# Patient Record
Sex: Female | Born: 1950 | Race: White | Hispanic: No | Smoking: Current every day smoker
Health system: Southern US, Community
[De-identification: ages and names within clinical notes are randomized; demographics above are authoritative.]

## PROBLEM LIST (undated history)

## (undated) DIAGNOSIS — E785 Hyperlipidemia, unspecified: Secondary | ICD-10-CM

## (undated) DIAGNOSIS — F32A Depression, unspecified: Secondary | ICD-10-CM

## (undated) DIAGNOSIS — Z9049 Acquired absence of other specified parts of digestive tract: Secondary | ICD-10-CM

## (undated) DIAGNOSIS — Z90710 Acquired absence of both cervix and uterus: Secondary | ICD-10-CM

## (undated) DIAGNOSIS — F419 Anxiety disorder, unspecified: Secondary | ICD-10-CM

## (undated) DIAGNOSIS — M1990 Unspecified osteoarthritis, unspecified site: Secondary | ICD-10-CM

## (undated) DIAGNOSIS — M797 Fibromyalgia: Secondary | ICD-10-CM

## (undated) HISTORY — PX: CHOLECYSTECTOMY: SHX55

## (undated) HISTORY — PX: APPENDECTOMY: SHX54

---

## 1998-04-12 ENCOUNTER — Inpatient Hospital Stay (HOSPITAL_COMMUNITY): Admission: RE | Admit: 1998-04-12 | Discharge: 1998-04-15 | Payer: Self-pay | Admitting: Obstetrics and Gynecology

## 1999-07-01 ENCOUNTER — Other Ambulatory Visit: Admission: RE | Admit: 1999-07-01 | Discharge: 1999-07-01 | Payer: Self-pay | Admitting: Obstetrics and Gynecology

## 2002-02-05 ENCOUNTER — Emergency Department (HOSPITAL_COMMUNITY): Admission: EM | Admit: 2002-02-05 | Discharge: 2002-02-05 | Payer: Self-pay | Admitting: Emergency Medicine

## 2003-09-02 ENCOUNTER — Ambulatory Visit (HOSPITAL_COMMUNITY): Admission: RE | Admit: 2003-09-02 | Discharge: 2003-09-02 | Payer: Self-pay | Admitting: Family Medicine

## 2004-09-07 ENCOUNTER — Ambulatory Visit (HOSPITAL_COMMUNITY): Admission: RE | Admit: 2004-09-07 | Discharge: 2004-09-07 | Payer: Self-pay | Admitting: Family Medicine

## 2005-09-11 ENCOUNTER — Ambulatory Visit (HOSPITAL_COMMUNITY): Admission: RE | Admit: 2005-09-11 | Discharge: 2005-09-11 | Payer: Self-pay | Admitting: Family Medicine

## 2005-10-11 ENCOUNTER — Ambulatory Visit (HOSPITAL_COMMUNITY): Admission: RE | Admit: 2005-10-11 | Discharge: 2005-10-11 | Payer: Self-pay | Admitting: Family Medicine

## 2006-04-18 ENCOUNTER — Ambulatory Visit (HOSPITAL_COMMUNITY): Admission: RE | Admit: 2006-04-18 | Discharge: 2006-04-18 | Payer: Self-pay | Admitting: Family Medicine

## 2006-09-26 ENCOUNTER — Ambulatory Visit (HOSPITAL_COMMUNITY): Admission: RE | Admit: 2006-09-26 | Discharge: 2006-09-26 | Payer: Self-pay | Admitting: Family Medicine

## 2007-10-02 ENCOUNTER — Ambulatory Visit (HOSPITAL_COMMUNITY): Admission: RE | Admit: 2007-10-02 | Discharge: 2007-10-02 | Payer: Self-pay | Admitting: Family Medicine

## 2007-10-05 ENCOUNTER — Emergency Department (HOSPITAL_COMMUNITY): Admission: EM | Admit: 2007-10-05 | Discharge: 2007-10-05 | Payer: Self-pay | Admitting: Emergency Medicine

## 2008-10-21 ENCOUNTER — Ambulatory Visit (HOSPITAL_COMMUNITY): Admission: RE | Admit: 2008-10-21 | Discharge: 2008-10-21 | Payer: Self-pay | Admitting: Family Medicine

## 2009-10-25 ENCOUNTER — Ambulatory Visit (HOSPITAL_COMMUNITY): Admission: RE | Admit: 2009-10-25 | Discharge: 2009-10-25 | Payer: Self-pay | Admitting: Family Medicine

## 2010-10-28 ENCOUNTER — Other Ambulatory Visit (HOSPITAL_COMMUNITY): Payer: Self-pay | Admitting: Family Medicine

## 2010-10-28 DIAGNOSIS — Z139 Encounter for screening, unspecified: Secondary | ICD-10-CM

## 2010-11-07 ENCOUNTER — Ambulatory Visit (HOSPITAL_COMMUNITY)
Admission: RE | Admit: 2010-11-07 | Discharge: 2010-11-07 | Disposition: A | Discharge status: Home / Self Care | Payer: 59 | Source: Ambulatory Visit | Attending: Family Medicine | Admitting: Family Medicine

## 2010-11-07 DIAGNOSIS — Z1231 Encounter for screening mammogram for malignant neoplasm of breast: Secondary | ICD-10-CM | POA: Insufficient documentation

## 2010-11-07 DIAGNOSIS — Z139 Encounter for screening, unspecified: Secondary | ICD-10-CM

## 2011-10-10 ENCOUNTER — Other Ambulatory Visit (HOSPITAL_COMMUNITY): Payer: Self-pay | Admitting: Family Medicine

## 2011-10-10 DIAGNOSIS — Z139 Encounter for screening, unspecified: Secondary | ICD-10-CM

## 2011-11-13 ENCOUNTER — Ambulatory Visit (HOSPITAL_COMMUNITY)
Admission: RE | Admit: 2011-11-13 | Discharge: 2011-11-13 | Disposition: A | Discharge status: Home / Self Care | Payer: 59 | Source: Ambulatory Visit | Attending: Family Medicine | Admitting: Family Medicine

## 2011-11-13 DIAGNOSIS — Z139 Encounter for screening, unspecified: Secondary | ICD-10-CM

## 2011-11-13 DIAGNOSIS — Z1231 Encounter for screening mammogram for malignant neoplasm of breast: Secondary | ICD-10-CM | POA: Insufficient documentation

## 2012-12-18 ENCOUNTER — Encounter (HOSPITAL_COMMUNITY): Payer: Self-pay

## 2012-12-18 ENCOUNTER — Emergency Department (HOSPITAL_COMMUNITY): Payer: Self-pay

## 2012-12-18 ENCOUNTER — Emergency Department (HOSPITAL_COMMUNITY)
Admission: EM | Admit: 2012-12-18 | Discharge: 2012-12-18 | Disposition: A | Discharge status: Home / Self Care | Payer: Self-pay | Attending: Emergency Medicine | Admitting: Emergency Medicine

## 2012-12-18 DIAGNOSIS — Z862 Personal history of diseases of the blood and blood-forming organs and certain disorders involving the immune mechanism: Secondary | ICD-10-CM | POA: Insufficient documentation

## 2012-12-18 DIAGNOSIS — M7650 Patellar tendinitis, unspecified knee: Secondary | ICD-10-CM | POA: Insufficient documentation

## 2012-12-18 DIAGNOSIS — Z79899 Other long term (current) drug therapy: Secondary | ICD-10-CM | POA: Insufficient documentation

## 2012-12-18 DIAGNOSIS — Z8739 Personal history of other diseases of the musculoskeletal system and connective tissue: Secondary | ICD-10-CM | POA: Insufficient documentation

## 2012-12-18 DIAGNOSIS — M76892 Other specified enthesopathies of left lower limb, excluding foot: Secondary | ICD-10-CM

## 2012-12-18 DIAGNOSIS — Z8639 Personal history of other endocrine, nutritional and metabolic disease: Secondary | ICD-10-CM | POA: Insufficient documentation

## 2012-12-18 DIAGNOSIS — M25469 Effusion, unspecified knee: Secondary | ICD-10-CM | POA: Insufficient documentation

## 2012-12-18 HISTORY — DX: Hyperlipidemia, unspecified: E78.5

## 2012-12-18 HISTORY — DX: Fibromyalgia: M79.7

## 2012-12-18 MED ORDER — IBUPROFEN 800 MG PO TABS: 800.0000 mg | ORAL_TABLET | Freq: Three times a day (TID) | ORAL | Status: DC

## 2012-12-18 NOTE — ED Notes (Signed)
Pt c/o pain and swelling to left knee for several days.  Pt states she is on her feet a lot at work but denies new injury.

## 2012-12-18 NOTE — ED Provider Notes (Signed)
  CSN: [id number]     Arrival date & time 12/18/12  0630 History     First MD Initiated Contact with Patient 12/18/12 [phone]     Chief Complaint  Patient presents with  . Knee Pain   (Consider location/radiation/quality/duration/timing/severity/associated sxs/prior Treatment) HPI Comments: Patient presents with left knee pain and swelling. This reports that the pain began several days ago. She reports that it hurts more when she stands for long periods of time. She reports that she worked yesterday, was on her feet for a period of time and the pain is now worse. She denies any injury to the area.  Patient is a 62 y.o. female presenting with knee pain.  Knee Pain Associated symptoms: no fever     Past Medical History  Diagnosis Date  . Hyperlipidemia   . Fibromyalgia    No past surgical history on file. No family history on file. History  Substance Use Topics  . Smoking status: Not on file  . Smokeless tobacco: Not on file  . Alcohol Use: Not on file   OB History   Grav Para Term Preterm Abortions TAB SAB Ect Mult Living                 Review of Systems  Constitutional: Negative for fever.  Musculoskeletal: Positive for joint swelling and arthralgias.    Allergies  Review of patient's allergies indicates no known allergies.  Home Medications   Current Outpatient Rx  Name  Route  Sig  Dispense  Refill  . FLUoxetine (PROZAC) 20 MG capsule   Oral   Take 20 mg by mouth daily.         Marland Kitchen HYDROcodone-acetaminophen (NORCO) 10-325 MG per tablet   Oral   Take 1 tablet by mouth every 6 (six) hours as needed for pain.          BP 143/92  Pulse 94  Temp(Src) 97.4 F (36.3 C) (Oral)  Resp 20  Ht 5' 2.5" (1.588 m)  Wt 138 lb (62.596 kg)  BMI 24.82 kg/m2  SpO2 96% Physical Exam  Constitutional: She appears well-developed.  HENT:  Head: Normocephalic.  Musculoskeletal: Normal range of motion.       Legs: Tenderness is infero-medial to joint    ED Course    Procedures (including critical care time)  Labs Reviewed - No data to display No results found.  Diagnosis: Tendinitis  MDM  Condition presents to ER with complaints nontraumatic left knee pain. Pain is distal to the joint space, medial aspect of the knee. There is no joint effusion, redness, warmth or swelling. Symptoms are consistent with a tendinitis. She already takes hydrocodone for her fibromyalgia. Will add anti-inflammatory, rest.  Gilda Crease, MD 12/18/12 [phone]

## 2014-07-22 ENCOUNTER — Other Ambulatory Visit (HOSPITAL_COMMUNITY): Payer: Self-pay | Admitting: Family Medicine

## 2014-07-22 DIAGNOSIS — Z1231 Encounter for screening mammogram for malignant neoplasm of breast: Secondary | ICD-10-CM

## 2014-07-24 ENCOUNTER — Encounter (INDEPENDENT_AMBULATORY_CARE_PROVIDER_SITE_OTHER): Payer: Self-pay | Admitting: *Deleted

## 2014-07-29 ENCOUNTER — Ambulatory Visit (HOSPITAL_COMMUNITY): Payer: Self-pay

## 2014-07-30 ENCOUNTER — Encounter (INDEPENDENT_AMBULATORY_CARE_PROVIDER_SITE_OTHER): Payer: Self-pay | Admitting: *Deleted

## 2014-07-30 ENCOUNTER — Other Ambulatory Visit (INDEPENDENT_AMBULATORY_CARE_PROVIDER_SITE_OTHER): Payer: Self-pay | Admitting: *Deleted

## 2014-07-30 DIAGNOSIS — Z1211 Encounter for screening for malignant neoplasm of colon: Secondary | ICD-10-CM

## 2014-08-19 ENCOUNTER — Telehealth (INDEPENDENT_AMBULATORY_CARE_PROVIDER_SITE_OTHER): Payer: Self-pay | Admitting: *Deleted

## 2014-08-19 DIAGNOSIS — Z1211 Encounter for screening for malignant neoplasm of colon: Secondary | ICD-10-CM

## 2014-08-19 NOTE — Telephone Encounter (Signed)
Patent needs trilyte 

## 2014-08-20 MED ORDER — PEG 3350-KCL-NA BICARB-NACL 420 G PO SOLR: 4000.0000 mL | Freq: Once | ORAL | Status: DC

## 2014-09-08 ENCOUNTER — Telehealth (INDEPENDENT_AMBULATORY_CARE_PROVIDER_SITE_OTHER): Payer: Self-pay | Admitting: *Deleted

## 2014-09-08 NOTE — Telephone Encounter (Signed)
agree

## 2014-09-08 NOTE — Telephone Encounter (Signed)
Referring MD/PCP: knowlton   Procedure: tcs  Reason/Indication:  screening  Has patient had this procedure before?  no  If so, when, by whom and where?    Is there a family history of colon cancer?  no  Who?  What age when diagnosed?    Is patient diabetic?   no      Does patient have prosthetic heart valve?  no  Do you have a pacemaker?  no  Has patient ever had endocarditis? no  Has patient had joint replacement within last 12 months?  no  Does patient tend to be constipated or take laxatives? no  Is patient on Coumadin, Plavix and/or Aspirin? yes  Medications: asa 81 mg daily  Allergies: nkda  Medication Adjustment: asa 2 days  Procedure date & time: 10/07/14 at 930

## 2014-10-05 ENCOUNTER — Ambulatory Visit (HOSPITAL_COMMUNITY)
Admission: RE | Admit: 2014-10-05 | Discharge: 2014-10-05 | Disposition: A | Discharge status: Home / Self Care | Payer: 59 | Source: Ambulatory Visit | Attending: Family Medicine | Admitting: Family Medicine

## 2014-10-05 DIAGNOSIS — Z1231 Encounter for screening mammogram for malignant neoplasm of breast: Secondary | ICD-10-CM | POA: Insufficient documentation

## 2014-10-07 ENCOUNTER — Ambulatory Visit (HOSPITAL_COMMUNITY)
Admission: RE | Admit: 2014-10-07 | Discharge: 2014-10-07 | Disposition: A | Discharge status: Home / Self Care | Payer: 59 | Source: Ambulatory Visit | Attending: Internal Medicine | Admitting: Internal Medicine

## 2014-10-07 ENCOUNTER — Encounter (HOSPITAL_COMMUNITY)
Admission: RE | Disposition: A | Discharge status: Home / Self Care | Payer: Self-pay | Source: Ambulatory Visit | Attending: Internal Medicine

## 2014-10-07 ENCOUNTER — Encounter (HOSPITAL_COMMUNITY): Payer: Self-pay | Admitting: *Deleted

## 2014-10-07 DIAGNOSIS — Z1211 Encounter for screening for malignant neoplasm of colon: Secondary | ICD-10-CM | POA: Diagnosis not present

## 2014-10-07 DIAGNOSIS — Z79899 Other long term (current) drug therapy: Secondary | ICD-10-CM | POA: Diagnosis not present

## 2014-10-07 DIAGNOSIS — Z9049 Acquired absence of other specified parts of digestive tract: Secondary | ICD-10-CM | POA: Insufficient documentation

## 2014-10-07 DIAGNOSIS — D125 Benign neoplasm of sigmoid colon: Secondary | ICD-10-CM | POA: Diagnosis not present

## 2014-10-07 DIAGNOSIS — Z7982 Long term (current) use of aspirin: Secondary | ICD-10-CM | POA: Diagnosis not present

## 2014-10-07 DIAGNOSIS — F17210 Nicotine dependence, cigarettes, uncomplicated: Secondary | ICD-10-CM | POA: Insufficient documentation

## 2014-10-07 DIAGNOSIS — E785 Hyperlipidemia, unspecified: Secondary | ICD-10-CM | POA: Insufficient documentation

## 2014-10-07 DIAGNOSIS — D124 Benign neoplasm of descending colon: Secondary | ICD-10-CM | POA: Diagnosis not present

## 2014-10-07 DIAGNOSIS — M797 Fibromyalgia: Secondary | ICD-10-CM | POA: Insufficient documentation

## 2014-10-07 HISTORY — DX: Acquired absence of both cervix and uterus: Z90.710

## 2014-10-07 HISTORY — DX: Acquired absence of other specified parts of digestive tract: Z90.49

## 2014-10-07 HISTORY — PX: COLONOSCOPY: SHX5424

## 2014-10-07 SURGERY — COLONOSCOPY
Anesthesia: Moderate Sedation

## 2014-10-07 MED ORDER — STERILE WATER FOR IRRIGATION IR SOLN
Status: DC | PRN
  Administered 2014-10-07: 09:00:00

## 2014-10-07 MED ORDER — MEPERIDINE HCL 50 MG/ML IJ SOLN
INTRAMUSCULAR | Status: DC | PRN
  Administered 2014-10-07 (×2): 25 mg via INTRAVENOUS

## 2014-10-07 MED ORDER — MIDAZOLAM HCL 5 MG/5ML IJ SOLN
INTRAMUSCULAR | Status: AC
  Filled 2014-10-07: qty 10

## 2014-10-07 MED ORDER — SODIUM CHLORIDE 0.9 % IV SOLN
INTRAVENOUS | Status: DC
  Administered 2014-10-07: 1000 mL via INTRAVENOUS

## 2014-10-07 MED ORDER — MIDAZOLAM HCL 5 MG/5ML IJ SOLN
INTRAMUSCULAR | Status: DC | PRN
  Administered 2014-10-07: 1 mg via INTRAVENOUS
  Administered 2014-10-07: 2 mg via INTRAVENOUS
  Administered 2014-10-07: 1 mg via INTRAVENOUS
  Administered 2014-10-07 (×2): 2 mg via INTRAVENOUS

## 2014-10-07 MED ORDER — MEPERIDINE HCL 50 MG/ML IJ SOLN
INTRAMUSCULAR | Status: AC
  Filled 2014-10-07: qty 1

## 2014-10-07 NOTE — Discharge Instructions (Signed)
No aspirin or NSAIDs for 1 week. Resume other medications and diet as before. No driving for 24 hours. Physician will call with biopsy results.  Colonoscopy, Care After Refer to this sheet in the next few weeks. These instructions provide you with information on caring for yourself after your procedure. Your health care provider may also give you more specific instructions. Your treatment has been planned according to current medical practices, but problems sometimes occur. Call your health care provider if you have any problems or questions after your procedure. WHAT TO EXPECT AFTER THE PROCEDURE  After your procedure, it is typical to have the following:  A small amount of blood in your stool.  Moderate amounts of gas and mild abdominal cramping or bloating. HOME CARE INSTRUCTIONS  Do not drive, operate machinery, or sign important documents for 24 hours.  You may shower and resume your regular physical activities, but move at a slower pace for the first 24 hours.  Take frequent rest periods for the first 24 hours.  Walk around or put a warm pack on your abdomen to help reduce abdominal cramping and bloating.  Drink enough fluids to keep your urine clear or pale yellow.  You may resume your normal diet as instructed by your health care provider. Avoid heavy or fried foods that are hard to digest.  Avoid drinking alcohol for 24 hours or as instructed by your health care provider.  Only take over-the-counter or prescription medicines as directed by your health care provider.  If a tissue sample (biopsy) was taken during your procedure:  Do not take aspirin or blood thinners for 7 days, or as instructed by your health care provider.  Do not drink alcohol for 7 days, or as instructed by your health care provider.  Eat soft foods for the first 24 hours. SEEK MEDICAL CARE IF: You have persistent spotting of blood in your stool 2-3 days after the procedure. SEEK IMMEDIATE MEDICAL  CARE IF:  You have more than a small spotting of blood in your stool.  You pass large blood clots in your stool.  Your abdomen is swollen (distended).  You have nausea or vomiting.  You have a fever.  You have increasing abdominal pain that is not relieved with medicine. Document Released: 12/14/2003 Document Revised: 02/19/2013 Document Reviewed: 01/06/2013 [facility] Patient Information 2015 [place], [place]. This information is not intended to replace advice given to you by your health care provider. Make sure you discuss any questions you have with your health care provider.

## 2014-10-07 NOTE — Op Note (Signed)
COLONOSCOPY PROCEDURE REPORT  PATIENT:  Valerie Gibbs  MR#:  [id number] Birthdate:  [birth year], 64 y.o., female Endoscopist:  Dr. Rogene Houston, MD Referred By:  Dr. Robert Bellow, MD  Procedure Date: 10/07/2014  Procedure:   Colonoscopy with snare polypectomy.  Indications:  Patient is 64 year old Caucasian female was undergoing average risk screening colonoscopy. This is patient's first exam.  Informed Consent:  The procedure and risks were reviewed with the patient and informed consent was obtained.  Medications:  Demerol 50 mg IV Versed 8 mg IV  Description of procedure:  After a digital rectal exam was performed, that colonoscope was advanced from the anus through the rectum and colon to the area of the cecum, ileocecal valve and appendiceal orifice. The cecum was deeply intubated. These structures were well-seen and photographed for the record. From the level of the cecum and ileocecal valve, the scope was slowly and cautiously withdrawn. The mucosal surfaces were carefully surveyed utilizing scope tip to flexion to facilitate fold flattening as needed. The scope was pulled down into the rectum where a thorough exam including retroflexion was performed.  Findings:   Prep excellent. 12 mm polyp on a short stalk hot snared from distal sigmoid colon. 5 mm polyp cold snared from distal sigmoid colon. Both of these polyps were submitted together. Normal rectal mucosa and anorectal junction.   Therapeutic/Diagnostic Maneuvers Performed:  See above  Complications:  None  Cecal Withdrawal Time:  13 minutes  Impression:  Examination performed to cecum. 12 mm polyp hot snared from distal sigmoid colon. 5 mm polyp cold snared from distal sigmoid colon. Both of these polyps were submitted together  Recommendations:  Standard instructions given. No aspirin or NSAIDs for 1 week. I will be contacting patient with biopsy results and further recommendations.  Bronislaw Switzer U   10/07/2014 10:02 AM  CC: Dr. Robert Bellow, MD & Dr. Rayne Du ref. provider found

## 2014-10-07 NOTE — H&P (Signed)
Valerie Gibbs is an 64 y.o. female.   Chief Complaint: Patient is here for colonoscopy. HPI: Patient is 64 year old Caucasian female who is here for screening colonoscopy. Since patient's first exam. She denies abdominal pain change in bowel habits or rectal bleeding. Family history is negative for CRC.  Past Medical History  Diagnosis Date  . Hyperlipidemia   . Fibromyalgia   . S/P hysterectomy   . S/P appendectomy   . S/P cholecystectomy     Past Surgical History  Procedure Laterality Date  . Cholecystectomy    . Appendectomy      Family History  Problem Relation Age of Onset  . Breast cancer Mother   . Cancer Mother   . Heart attack Father   . Coronary artery disease Sister   . Hyperlipidemia Sister   . Hypertension Brother   . Chronic bronchitis Brother   . Hyperlipidemia Brother    Social History:  reports that she has been smoking Cigarettes.  She has been smoking about 0.25 packs per day. She does not have any smokeless tobacco history on file. She reports that she drinks alcohol. She reports that she does not use illicit drugs.  Allergies: No Known Allergies  Medications Prior to Admission  Medication Sig Dispense Refill  . aspirin EC 81 MG tablet Take 81 mg by mouth daily.    Marland Kitchen atorvastatin (LIPITOR) 80 MG tablet Take 80 mg by mouth daily.    . nicotine (NICODERM CQ - DOSED IN MG/24 HOURS) 21 mg/24hr patch Place 21 mg onto the skin daily.    Marland Kitchen ibuprofen (ADVIL,MOTRIN) 800 MG tablet Take 1 tablet (800 mg total) by mouth 3 (three) times daily. (Patient not taking: Reported on 09/30/2014) 21 tablet 0  . polyethylene glycol-electrolytes (NULYTELY/GOLYTELY) 420 G solution Take 4,000 mLs by mouth once. (Patient not taking: Reported on 09/30/2014) 4000 mL 0    No results found for this or any previous visit (from the past 48 hour(s)). Mm Digital Screening Bilateral  10/05/2014   CLINICAL DATA:  Screening.  EXAM: DIGITAL SCREENING BILATERAL MAMMOGRAM WITH CAD  COMPARISON:   Previous exam(s).  ACR Breast Density Category b: There are scattered areas of fibroglandular density.  FINDINGS: There are no findings suspicious for malignancy. Images were processed with CAD.  IMPRESSION: No mammographic evidence of malignancy. A result letter of this screening mammogram will be mailed directly to the patient.  RECOMMENDATION: Screening mammogram in one year. (Code:SM-B-01Y)  BI-RADS CATEGORY  1: Negative.   Electronically Signed   By: Lillia Mountain M.D.   On: 10/05/2014 15:42    ROS  Blood pressure 138/84, pulse 99, temperature 97.7 F (36.5 C), temperature source Oral, resp. rate 19, height 5\' 2"  (1.575 m), weight 145 lb (65.772 kg), SpO2 96 %. Physical Exam  Constitutional: She appears well-developed and well-nourished.  HENT:  Mouth/Throat: Oropharynx is clear and moist.  Eyes: Conjunctivae are normal. No scleral icterus.  Neck: No thyromegaly present.  Cardiovascular: Normal rate and regular rhythm.   No murmur heard. Respiratory: Effort normal and breath sounds normal.  GI: Soft. She exhibits no distension and no mass. There is no tenderness.  Musculoskeletal: She exhibits no edema.  Lymphadenopathy:    She has no cervical adenopathy.  Neurological: She is alert.  Skin: Skin is warm and dry.     Assessment/Plan Average risk screening colonoscopy.  Cincere Deprey U 10/07/2014, 9:23 AM

## 2014-10-08 ENCOUNTER — Encounter (HOSPITAL_COMMUNITY): Payer: Self-pay | Admitting: Internal Medicine

## 2014-10-13 ENCOUNTER — Encounter (INDEPENDENT_AMBULATORY_CARE_PROVIDER_SITE_OTHER): Payer: Self-pay | Admitting: *Deleted

## 2015-09-14 ENCOUNTER — Other Ambulatory Visit (HOSPITAL_COMMUNITY): Payer: Self-pay | Admitting: Family Medicine

## 2015-09-14 DIAGNOSIS — Z1231 Encounter for screening mammogram for malignant neoplasm of breast: Secondary | ICD-10-CM

## 2015-10-06 ENCOUNTER — Ambulatory Visit (HOSPITAL_COMMUNITY)
Admission: RE | Admit: 2015-10-06 | Discharge: 2015-10-06 | Disposition: A | Discharge status: Home / Self Care | Payer: BLUE CROSS/BLUE SHIELD | Source: Ambulatory Visit | Attending: Family Medicine | Admitting: Family Medicine

## 2015-10-06 DIAGNOSIS — Z1231 Encounter for screening mammogram for malignant neoplasm of breast: Secondary | ICD-10-CM | POA: Diagnosis present

## 2016-06-08 ENCOUNTER — Ambulatory Visit (HOSPITAL_COMMUNITY)
Admission: RE | Admit: 2016-06-08 | Discharge: 2016-06-08 | Disposition: A | Discharge status: Home / Self Care | Payer: BLUE CROSS/BLUE SHIELD | Source: Ambulatory Visit | Attending: Family Medicine | Admitting: Family Medicine

## 2016-06-08 ENCOUNTER — Other Ambulatory Visit (HOSPITAL_COMMUNITY): Payer: Self-pay | Admitting: Family Medicine

## 2016-06-08 DIAGNOSIS — M545 Low back pain: Secondary | ICD-10-CM | POA: Diagnosis present

## 2016-06-08 DIAGNOSIS — M5387 Other specified dorsopathies, lumbosacral region: Secondary | ICD-10-CM | POA: Diagnosis not present

## 2016-06-08 DIAGNOSIS — I700 Atherosclerosis of aorta: Secondary | ICD-10-CM | POA: Insufficient documentation

## 2016-06-08 DIAGNOSIS — M5136 Other intervertebral disc degeneration, lumbar region: Secondary | ICD-10-CM | POA: Diagnosis not present

## 2016-06-28 ENCOUNTER — Other Ambulatory Visit (HOSPITAL_COMMUNITY): Payer: Self-pay | Admitting: Family Medicine

## 2016-06-28 DIAGNOSIS — G8929 Other chronic pain: Secondary | ICD-10-CM

## 2016-06-28 DIAGNOSIS — M545 Low back pain, unspecified: Secondary | ICD-10-CM

## 2016-07-04 ENCOUNTER — Ambulatory Visit (HOSPITAL_COMMUNITY)
Admission: RE | Admit: 2016-07-04 | Discharge: 2016-07-04 | Disposition: A | Discharge status: Home / Self Care | Payer: BLUE CROSS/BLUE SHIELD | Source: Ambulatory Visit | Attending: Family Medicine | Admitting: Family Medicine

## 2016-07-04 DIAGNOSIS — M545 Low back pain, unspecified: Secondary | ICD-10-CM

## 2016-07-04 DIAGNOSIS — M2578 Osteophyte, vertebrae: Secondary | ICD-10-CM | POA: Diagnosis not present

## 2016-07-04 DIAGNOSIS — M5136 Other intervertebral disc degeneration, lumbar region: Secondary | ICD-10-CM | POA: Diagnosis not present

## 2016-07-04 DIAGNOSIS — M5137 Other intervertebral disc degeneration, lumbosacral region: Secondary | ICD-10-CM | POA: Insufficient documentation

## 2016-07-04 DIAGNOSIS — G8929 Other chronic pain: Secondary | ICD-10-CM

## 2016-08-24 ENCOUNTER — Other Ambulatory Visit (HOSPITAL_COMMUNITY): Payer: Self-pay | Admitting: Family Medicine

## 2016-08-24 DIAGNOSIS — Z1231 Encounter for screening mammogram for malignant neoplasm of breast: Secondary | ICD-10-CM

## 2016-10-06 ENCOUNTER — Ambulatory Visit (HOSPITAL_COMMUNITY)
Admission: RE | Admit: 2016-10-06 | Discharge: 2016-10-06 | Disposition: A | Discharge status: Home / Self Care | Payer: Medicare Other | Source: Ambulatory Visit | Attending: Family Medicine | Admitting: Family Medicine

## 2016-10-06 DIAGNOSIS — Z1231 Encounter for screening mammogram for malignant neoplasm of breast: Secondary | ICD-10-CM | POA: Diagnosis not present

## 2017-08-29 ENCOUNTER — Other Ambulatory Visit (HOSPITAL_COMMUNITY): Payer: Self-pay | Admitting: Family Medicine

## 2017-08-29 DIAGNOSIS — Z1231 Encounter for screening mammogram for malignant neoplasm of breast: Secondary | ICD-10-CM

## 2017-10-10 ENCOUNTER — Ambulatory Visit (HOSPITAL_COMMUNITY)
Admission: RE | Admit: 2017-10-10 | Discharge: 2017-10-10 | Disposition: A | Discharge status: Home / Self Care | Payer: Medicare HMO | Source: Ambulatory Visit | Attending: Family Medicine | Admitting: Family Medicine

## 2017-10-10 DIAGNOSIS — Z1231 Encounter for screening mammogram for malignant neoplasm of breast: Secondary | ICD-10-CM | POA: Diagnosis present

## 2018-10-21 ENCOUNTER — Other Ambulatory Visit (HOSPITAL_COMMUNITY): Payer: Self-pay | Admitting: Family Medicine

## 2018-10-21 DIAGNOSIS — Z1231 Encounter for screening mammogram for malignant neoplasm of breast: Secondary | ICD-10-CM

## 2018-11-01 ENCOUNTER — Ambulatory Visit (HOSPITAL_COMMUNITY)
Admission: RE | Admit: 2018-11-01 | Discharge: 2018-11-01 | Disposition: A | Discharge status: Home / Self Care | Payer: Medicare HMO | Source: Ambulatory Visit | Attending: Family Medicine | Admitting: Family Medicine

## 2018-11-01 ENCOUNTER — Other Ambulatory Visit: Payer: Self-pay

## 2018-11-01 DIAGNOSIS — Z1231 Encounter for screening mammogram for malignant neoplasm of breast: Secondary | ICD-10-CM | POA: Insufficient documentation

## 2019-05-28 DIAGNOSIS — F419 Anxiety disorder, unspecified: Secondary | ICD-10-CM | POA: Diagnosis not present

## 2019-05-28 DIAGNOSIS — E782 Mixed hyperlipidemia: Secondary | ICD-10-CM | POA: Diagnosis not present

## 2019-05-28 DIAGNOSIS — Z130 Encounter for screening for diseases of the blood and blood-forming organs and certain disorders involving the immune mechanism: Secondary | ICD-10-CM | POA: Diagnosis not present

## 2019-05-28 DIAGNOSIS — R000 Tachycardia, unspecified: Secondary | ICD-10-CM | POA: Diagnosis not present

## 2019-05-28 DIAGNOSIS — M797 Fibromyalgia: Secondary | ICD-10-CM | POA: Diagnosis not present

## 2019-05-28 DIAGNOSIS — Z1321 Encounter for screening for nutritional disorder: Secondary | ICD-10-CM | POA: Diagnosis not present

## 2019-05-28 DIAGNOSIS — F3342 Major depressive disorder, recurrent, in full remission: Secondary | ICD-10-CM | POA: Diagnosis not present

## 2019-05-28 DIAGNOSIS — F17210 Nicotine dependence, cigarettes, uncomplicated: Secondary | ICD-10-CM | POA: Diagnosis not present

## 2019-05-28 DIAGNOSIS — E663 Overweight: Secondary | ICD-10-CM | POA: Diagnosis not present

## 2019-05-28 DIAGNOSIS — I10 Essential (primary) hypertension: Secondary | ICD-10-CM | POA: Diagnosis not present

## 2019-05-28 DIAGNOSIS — Z139 Encounter for screening, unspecified: Secondary | ICD-10-CM | POA: Diagnosis not present

## 2019-06-16 DIAGNOSIS — H52223 Regular astigmatism, bilateral: Secondary | ICD-10-CM | POA: Diagnosis not present

## 2019-06-16 DIAGNOSIS — Z961 Presence of intraocular lens: Secondary | ICD-10-CM | POA: Diagnosis not present

## 2019-06-16 DIAGNOSIS — H524 Presbyopia: Secondary | ICD-10-CM | POA: Diagnosis not present

## 2019-06-16 DIAGNOSIS — H5203 Hypermetropia, bilateral: Secondary | ICD-10-CM | POA: Diagnosis not present

## 2019-06-16 DIAGNOSIS — H2513 Age-related nuclear cataract, bilateral: Secondary | ICD-10-CM | POA: Diagnosis not present

## 2019-07-01 ENCOUNTER — Other Ambulatory Visit: Payer: Self-pay

## 2019-07-01 ENCOUNTER — Ambulatory Visit: Payer: Medicare HMO | Attending: Internal Medicine

## 2019-07-01 DIAGNOSIS — Z20822 Contact with and (suspected) exposure to covid-19: Secondary | ICD-10-CM

## 2019-07-02 LAB — NOVEL CORONAVIRUS, NAA: SARS-CoV-2, NAA: NOT DETECTED

## 2019-07-04 ENCOUNTER — Telehealth: Payer: Self-pay

## 2019-07-04 NOTE — Telephone Encounter (Signed)
Pt. Given COVID 19 results, verbalizes understanding. 

## 2019-09-10 ENCOUNTER — Encounter (INDEPENDENT_AMBULATORY_CARE_PROVIDER_SITE_OTHER): Payer: Self-pay | Admitting: *Deleted

## 2019-10-08 ENCOUNTER — Encounter (INDEPENDENT_AMBULATORY_CARE_PROVIDER_SITE_OTHER): Payer: Self-pay | Admitting: *Deleted

## 2019-10-08 ENCOUNTER — Other Ambulatory Visit (HOSPITAL_COMMUNITY): Payer: Self-pay | Admitting: Family Medicine

## 2019-10-08 ENCOUNTER — Other Ambulatory Visit (INDEPENDENT_AMBULATORY_CARE_PROVIDER_SITE_OTHER): Payer: Self-pay | Admitting: *Deleted

## 2019-10-08 ENCOUNTER — Telehealth (INDEPENDENT_AMBULATORY_CARE_PROVIDER_SITE_OTHER): Payer: Self-pay | Admitting: *Deleted

## 2019-10-08 DIAGNOSIS — Z1231 Encounter for screening mammogram for malignant neoplasm of breast: Secondary | ICD-10-CM

## 2019-10-08 NOTE — Telephone Encounter (Signed)
Referring MD/PCP: knowlton   Procedure: tcs w propofol  Reason/Indication:  Hx polyps  Has patient had this procedure before?  Yes, 2016  If so, when, by whom and where?    Is there a family history of colon cancer?  no  Who?  What age when diagnosed?    Is patient diabetic?   no      Does patient have prosthetic heart valve or mechanical valve?  no  Do you have a pacemaker/defibrillator?  no  Has patient ever had endocarditis/atrial fibrillation? no  Does patient use oxygen? no  Has patient had joint replacement within last 12 months?  no  Is patient constipated or do they take laxatives? no  Does patient have a history of alcohol/drug use?  no  Is patient on blood thinner such as Coumadin, Plavix and/or Aspirin? yes  Medications: gabapentin 300 mg 1-2 up to 3 times a day, clonazepam 1 mg 1/2 to 1 tab qid, atorvastatin 80 mg daily, duloxetine 60 mg bid, ezetimibe 10 mg daily  Allergies: nkda  Medication Adjustment per Dr Laural Golden: asa 2 days  Procedure date & time: 10/31/19

## 2019-10-08 NOTE — Telephone Encounter (Signed)
Patient needs Plenvu (copay card) ° °

## 2019-10-09 ENCOUNTER — Other Ambulatory Visit (INDEPENDENT_AMBULATORY_CARE_PROVIDER_SITE_OTHER): Payer: Self-pay | Admitting: *Deleted

## 2019-10-09 ENCOUNTER — Other Ambulatory Visit: Payer: Self-pay

## 2019-10-09 ENCOUNTER — Ambulatory Visit (INDEPENDENT_AMBULATORY_CARE_PROVIDER_SITE_OTHER): Payer: Self-pay

## 2019-10-09 DIAGNOSIS — Z86010 Personal history of colonic polyps: Secondary | ICD-10-CM

## 2019-10-09 MED ORDER — PLENVU 140 G PO SOLR: 1.0000 | Freq: Once | ORAL | 0 refills | Status: AC

## 2019-10-27 ENCOUNTER — Encounter (HOSPITAL_COMMUNITY): Payer: Self-pay

## 2019-10-29 ENCOUNTER — Other Ambulatory Visit (HOSPITAL_COMMUNITY)
Admission: RE | Admit: 2019-10-29 | Discharge: 2019-10-29 | Disposition: A | Discharge status: Home / Self Care | Payer: Medicare HMO | Source: Ambulatory Visit | Attending: Internal Medicine | Admitting: Internal Medicine

## 2019-10-29 ENCOUNTER — Other Ambulatory Visit: Payer: Self-pay

## 2019-10-29 ENCOUNTER — Encounter (HOSPITAL_COMMUNITY)
Admission: RE | Admit: 2019-10-29 | Discharge: 2019-10-29 | Disposition: A | Discharge status: Home / Self Care | Payer: Medicare HMO | Source: Ambulatory Visit | Attending: Internal Medicine | Admitting: Internal Medicine

## 2019-10-29 DIAGNOSIS — Z01812 Encounter for preprocedural laboratory examination: Secondary | ICD-10-CM | POA: Diagnosis present

## 2019-10-29 DIAGNOSIS — Z20822 Contact with and (suspected) exposure to covid-19: Secondary | ICD-10-CM | POA: Diagnosis not present

## 2019-10-30 LAB — SARS CORONAVIRUS 2 (TAT 6-24 HRS): SARS Coronavirus 2: NEGATIVE

## 2019-10-31 ENCOUNTER — Ambulatory Visit (HOSPITAL_COMMUNITY): Payer: Medicare HMO | Admitting: Anesthesiology

## 2019-10-31 ENCOUNTER — Encounter (HOSPITAL_COMMUNITY): Payer: Self-pay | Admitting: Internal Medicine

## 2019-10-31 ENCOUNTER — Ambulatory Visit (HOSPITAL_COMMUNITY)
Admission: RE | Admit: 2019-10-31 | Discharge: 2019-10-31 | Disposition: A | Discharge status: Home / Self Care | Payer: Medicare HMO | Attending: Internal Medicine | Admitting: Internal Medicine

## 2019-10-31 ENCOUNTER — Encounter (HOSPITAL_COMMUNITY)
Admission: RE | Disposition: A | Discharge status: Home / Self Care | Payer: Self-pay | Source: Home / Self Care | Attending: Internal Medicine

## 2019-10-31 ENCOUNTER — Other Ambulatory Visit: Payer: Self-pay

## 2019-10-31 DIAGNOSIS — K644 Residual hemorrhoidal skin tags: Secondary | ICD-10-CM

## 2019-10-31 DIAGNOSIS — Z7982 Long term (current) use of aspirin: Secondary | ICD-10-CM | POA: Diagnosis not present

## 2019-10-31 DIAGNOSIS — Z79899 Other long term (current) drug therapy: Secondary | ICD-10-CM | POA: Diagnosis not present

## 2019-10-31 DIAGNOSIS — F17210 Nicotine dependence, cigarettes, uncomplicated: Secondary | ICD-10-CM | POA: Diagnosis not present

## 2019-10-31 DIAGNOSIS — M797 Fibromyalgia: Secondary | ICD-10-CM | POA: Diagnosis not present

## 2019-10-31 DIAGNOSIS — Z86010 Personal history of colonic polyps: Secondary | ICD-10-CM | POA: Insufficient documentation

## 2019-10-31 DIAGNOSIS — Z09 Encounter for follow-up examination after completed treatment for conditions other than malignant neoplasm: Secondary | ICD-10-CM

## 2019-10-31 DIAGNOSIS — Z1211 Encounter for screening for malignant neoplasm of colon: Secondary | ICD-10-CM | POA: Diagnosis not present

## 2019-10-31 DIAGNOSIS — E785 Hyperlipidemia, unspecified: Secondary | ICD-10-CM | POA: Diagnosis not present

## 2019-10-31 HISTORY — PX: COLONOSCOPY WITH PROPOFOL: SHX5780

## 2019-10-31 SURGERY — COLONOSCOPY WITH PROPOFOL
Anesthesia: General

## 2019-10-31 MED ORDER — LIDOCAINE HCL (CARDIAC) PF 100 MG/5ML IV SOSY
PREFILLED_SYRINGE | INTRAVENOUS | Status: DC | PRN
Start: 2019-10-31 — End: 2019-10-31
  Administered 2019-10-31: 30 mg via INTRATRACHEAL

## 2019-10-31 MED ORDER — KETAMINE HCL 50 MG/5ML IJ SOSY
PREFILLED_SYRINGE | INTRAMUSCULAR | Status: AC
  Filled 2019-10-31: qty 5

## 2019-10-31 MED ORDER — LIDOCAINE 2% (20 MG/ML) 5 ML SYRINGE
INTRAMUSCULAR | Status: AC
  Filled 2019-10-31: qty 25

## 2019-10-31 MED ORDER — CHLORHEXIDINE GLUCONATE CLOTH 2 % EX PADS: 6.0000 | MEDICATED_PAD | Freq: Once | CUTANEOUS | Status: DC

## 2019-10-31 MED ORDER — KETAMINE HCL 10 MG/ML IJ SOLN
INTRAMUSCULAR | Status: DC | PRN
Start: 2019-10-31 — End: 2019-10-31
  Administered 2019-10-31: 20 mg via INTRAVENOUS

## 2019-10-31 MED ORDER — PROPOFOL 10 MG/ML IV BOLUS
INTRAVENOUS | Status: DC | PRN
  Administered 2019-10-31: 20 mg via INTRAVENOUS

## 2019-10-31 MED ORDER — MIDAZOLAM HCL 5 MG/5ML IJ SOLN
INTRAMUSCULAR | Status: AC
  Filled 2019-10-31: qty 10

## 2019-10-31 MED ORDER — PROPOFOL 500 MG/50ML IV EMUL
INTRAVENOUS | Status: DC | PRN
  Administered 2019-10-31: 125 ug/kg/min via INTRAVENOUS

## 2019-10-31 MED ORDER — MEPERIDINE HCL 50 MG/ML IJ SOLN
INTRAMUSCULAR | Status: AC
  Filled 2019-10-31: qty 1

## 2019-10-31 MED ORDER — ONDANSETRON HCL 4 MG/2ML IJ SOLN
INTRAMUSCULAR | Status: AC
  Filled 2019-10-31: qty 2

## 2019-10-31 MED ORDER — EPHEDRINE 5 MG/ML INJ
INTRAVENOUS | Status: AC
  Filled 2019-10-31: qty 20

## 2019-10-31 MED ORDER — PHENYLEPHRINE 40 MCG/ML (10ML) SYRINGE FOR IV PUSH (FOR BLOOD PRESSURE SUPPORT)
PREFILLED_SYRINGE | INTRAVENOUS | Status: AC
  Filled 2019-10-31: qty 30

## 2019-10-31 MED ORDER — LACTATED RINGERS IV SOLN: INTRAVENOUS | Status: DC

## 2019-10-31 MED ORDER — GLYCOPYRROLATE PF 0.2 MG/ML IJ SOSY
PREFILLED_SYRINGE | INTRAMUSCULAR | Status: AC
  Filled 2019-10-31: qty 5

## 2019-10-31 NOTE — Anesthesia Postprocedure Evaluation (Signed)
Anesthesia Post Note  Patient: Valerie Gibbs  Procedure(s) Performed: COLONOSCOPY WITH PROPOFOL (N/A )  Patient location during evaluation: PACU Anesthesia Type: General Level of consciousness: awake and alert Pain management: pain level controlled Vital Signs Assessment: post-procedure vital signs reviewed and stable Respiratory status: spontaneous breathing Cardiovascular status: stable Postop Assessment: no apparent nausea or vomiting Anesthetic complications: no   No complications documented.   Last Vitals:  Vitals:   10/31/19 0733 10/31/19 0852  BP:  (P) 105/67  Pulse:    Resp:    Temp: 36.7 C (!) (P) 36.3 C  SpO2:      Last Pain:  Vitals:   10/31/19 0852  TempSrc:   PainSc: (P) Asleep                 Kealy Lewter Hristova

## 2019-10-31 NOTE — H&P (Signed)
Valerie Gibbs is an 69 y.o. female.   Chief Complaint: Patient is here for colonoscopy. HPI: Patient is 69 year old Caucasian female who has history of colonic adenomas and is here for surveillance colonoscopy.  She denies abdominal pain change in bowel habits or rectal bleeding. Last aspirin dose was 2 days ago. Patient's last colonoscopy was in May 2016 with removal of a 12 mm tubular adenoma along with a smaller polyp. Family history is negative for CRC.  Past Medical History:  Diagnosis Date  . Fibromyalgia   . Hyperlipidemia   . S/P appendectomy   . S/P cholecystectomy   . S/P hysterectomy     Past Surgical History:  Procedure Laterality Date  . APPENDECTOMY    . CHOLECYSTECTOMY    . COLONOSCOPY N/A 10/07/2014   Procedure: COLONOSCOPY;  Surgeon: Rogene Houston, MD;  Location: [facility];  Service: Endoscopy;  Laterality: N/A;  930    Family History  Problem Relation Age of Onset  . Breast cancer Mother   . Cancer Mother   . Heart attack Father   . Coronary artery disease Sister   . Hyperlipidemia Sister   . Hypertension Brother   . Chronic bronchitis Brother   . Hyperlipidemia Brother    Social History:  reports that she has been smoking cigarettes. She has been smoking about 0.25 packs per day. She has never used smokeless tobacco. She reports current alcohol use. She reports that she does not use drugs.  Allergies: No Known Allergies  Medications Prior to Admission  Medication Sig Dispense Refill  . aspirin EC 81 MG tablet Take 81 mg by mouth daily.    Marland Kitchen atorvastatin (LIPITOR) 80 MG tablet Take 80 mg by mouth at bedtime.     . clonazePAM (KLONOPIN) 1 MG tablet Take 1 mg by mouth in the morning and at bedtime.    . DULoxetine (CYMBALTA) 60 MG capsule Take 60 mg by mouth in the morning and at bedtime.    Marland Kitchen ezetimibe (ZETIA) 10 MG tablet Take 10 mg by mouth at bedtime.    . gabapentin (NEURONTIN) 300 MG capsule Take 300 mg by mouth in the morning and at bedtime.     Vladimir Faster Glycol-Propyl Glycol (LUBRICANT EYE DROPS) 0.4-0.3 % SOLN Place 1 drop into both eyes 3 (three) times daily as needed (dry/irritated eyes.).    Marland Kitchen CHANTIX STARTING MONTH PAK 0.5 MG X 11 & 1 MG X 42 tablet Take 0.5-1 mg by mouth as directed. Days 1-3 take 0.5 mg tablet daily. Days 4-7 take 1 0.5 mg in the morning & evening. Day 8 to the end of treatment take 1 mg tablet in the morning.      Results for orders placed or performed during the hospital encounter of 10/29/19 (from the past 48 hour(s))  SARS CORONAVIRUS 2 (TAT 6-24 HRS) Nasopharyngeal Nasopharyngeal Swab     Status: None   Collection Time: 10/29/19  1:34 PM   Specimen: Nasopharyngeal Swab  Result Value Ref Range   SARS Coronavirus 2 NEGATIVE NEGATIVE    Comment: (NOTE) SARS-CoV-2 target nucleic acids are NOT DETECTED.  The SARS-CoV-2 RNA is generally detectable in upper and lower respiratory specimens during the acute phase of infection. Negative results do not preclude SARS-CoV-2 infection, do not rule out co-infections with other pathogens, and should not be used as the sole basis for treatment or other patient management decisions. Negative results must be combined with clinical observations, patient history, and epidemiological information. The expected  result is Negative.  Fact Sheet for Patients: SugarRoll.be  Fact Sheet for Healthcare Providers: https://www.woods-mathews.com/  This test is not yet approved or cleared by the [place] FDA and  has been authorized for detection and/or diagnosis of SARS-CoV-2 by FDA under an Emergency Use Authorization (EUA). This EUA will remain  in effect (meaning this test can be used) for the duration of the COVID-19 declaration under Se ction 564(b)(1) of the Act, 21 U.S.C. section 360bbb-3(b)(1), unless the authorization is terminated or revoked sooner.  Performed at [facility], [place] [address], [place],  [place] [address]    No results found.  Review of Systems  Blood pressure (!) 146/87, pulse 94, temperature 98 F (36.7 C), temperature source Oral, resp. rate 20, height 5' 1.5" (1.562 m), weight 56.7 kg, SpO2 93 %. Physical Exam  HENT:  Mouth/Throat: Mucous membranes are moist. Oropharynx is clear.  Eyes: Conjunctivae are normal. No scleral icterus.  Cardiovascular: Normal rate, regular rhythm and normal heart sounds.  No murmur heard. Respiratory: Effort normal and breath sounds normal.  GI:  Abdomen is full.  She has 4 scars.  She has right subcostal, lower midline appendectomy and other horizontal scar in right lower quadrant of her abdomen.  Abdomen is soft and nontender with organomegaly or masses.  Musculoskeletal:        General: No swelling.     Cervical back: Neck supple.  Lymphadenopathy:    She has no cervical adenopathy.  Neurological: She is alert.  Skin: Skin is warm and dry.  Psychiatric: Mood normal.     Assessment/Plan  History of colonic adenomas. Surveillance colonoscopy.  Hildred Laser, MD 10/31/2019, 8:16 AM

## 2019-10-31 NOTE — Op Note (Addendum)
[facility] Patient Name: Valerie Gibbs Procedure Date: 10/31/2019 8:50 AM MRN: [id number] Date of Birth: 1951/05/10 Attending MD: Hildred Laser , MD CSN: [id number] Age: 68 Admit Type: Outpatient Procedure:                Colonoscopy Indications:              High risk colon cancer surveillance: Personal                            history of colonic polyps Providers:                Hildred Laser, MD Referring MD:             Lemmie Evens, MD Medicines:                Propofol per Anesthesia Complications:            No immediate complications. Estimated Blood Loss:     Estimated blood loss: none. Procedure:                Pre-Anesthesia Assessment:                           - Prior to the procedure, a History and Physical                            was performed, and patient medications and                            allergies were reviewed. The patient's tolerance of                            previous anesthesia was also reviewed. The risks                            and benefits of the procedure and the sedation                            options and risks were discussed with the patient.                            All questions were answered, and informed consent                            was obtained. Prior Anticoagulants: The patient has                            taken no previous anticoagulant or antiplatelet                            agents except for aspirin. ASA Grade Assessment: II                            - A patient with mild systemic disease. After  reviewing the risks and benefits, the patient was                            deemed in satisfactory condition to undergo the                            procedure.                           After obtaining informed consent, the colonoscope                            was passed under direct vision. Throughout the                            procedure, the patient's blood pressure, pulse, and                             oxygen saturations were monitored continuously. The                            PCF-[id number] ([id number]) was introduced through the                            anus and advanced to the the cecum, identified by                            appendiceal orifice and ileocecal valve. The                            colonoscopy was performed without difficulty. The                            patient tolerated the procedure well. The quality                            of the bowel preparation was {skip}marginal. The                            ileocecal valve, appendiceal orifice, and rectum                            were photographed. Findings:      The perianal and digital rectal examinations were normal.      The colon (entire examined portion) appeared normal.      External hemorrhoids were found during retroflexion. The hemorrhoids       were small. Impression:               - The entire examined colon is normal.                           - External hemorrhoids.                           - No specimens collected. Moderate Sedation:  Per Anesthesia Care Recommendation:           - Patient has a contact number available for                            emergencies. The signs and symptoms of potential                            delayed complications were discussed with the                            patient. Return to normal activities tomorrow.                            Written discharge instructions were provided to the                            patient.                           - Resume previous diet today.                           - Continue present medications.                           - Repeat colonoscopy in 5 years for surveillance. Procedure Code(s):        --- Professional ---                           [phone], Colonoscopy, flexible; diagnostic, including                            collection of specimen(s) by brushing or washing,                             when performed (separate procedure) Diagnosis Code(s):        --- Professional ---                           Z86.010, Personal history of colonic polyps                           K64.4, Residual hemorrhoidal skin tags CPT copyright 2019 American Medical Association. All rights reserved. The codes documented in this report are preliminary and upon coder review may  be revised to meet current compliance requirements. Hildred Laser, MD Hildred Laser, MD 10/31/2019 9:00:46 AM This report has been signed electronically. Number of Addenda: 0

## 2019-10-31 NOTE — Transfer of Care (Signed)
Immediate Anesthesia Transfer of Care Note  Patient: [name]  Procedure(s) Performed: COLONOSCOPY WITH PROPOFOL (N/A )  Patient Location: PACU  Anesthesia Type:General  Level of Consciousness: awake  Airway & Oxygen Therapy: Patient Spontanous Breathing  Post-op Assessment: Report given to RN and Post -op Vital signs reviewed and stable  Post vital signs: Reviewed and stable  Last Vitals:  Vitals Value Taken Time  BP    Temp    Pulse 85 10/31/19 0852  Resp 15 10/31/19 0852  SpO2 94 % 10/31/19 0852  Vitals shown include unvalidated device data.  Last Pain:  Vitals:   10/31/19 0824  TempSrc:   PainSc: 0-No pain         Complications: No complications documented.

## 2019-10-31 NOTE — Discharge Instructions (Signed)
Resume usual medications and diet as before. No driving for 24 hours Next colonoscopy in 5 years.      Colonoscopy, Adult, Care After This sheet gives you information about how to care for yourself after your procedure. Your doctor may also give you more specific instructions. If you have problems or questions, call your doctor. What can I expect after the procedure? After the procedure, it is common to have:  A small amount of blood in your poop (stool) for 24 hours.  Some gas.  Mild cramping or bloating in your belly (abdomen). Follow these instructions at home: Eating and drinking   Drink enough fluid to keep your pee (urine) pale yellow.  Follow instructions from your doctor about what you cannot eat or drink.  Return to your normal diet as told by your doctor. Avoid heavy or fried foods that are hard to digest. Activity  Rest as told by your doctor.  Do not sit for a long time without moving. Get up to take short walks every 1-2 hours. This is important. Ask for help if you feel weak or unsteady.  Return to your normal activities as told by your doctor. Ask your doctor what activities are safe for you. To help cramping and bloating:   Try walking around.  Put heat on your belly as told by your doctor. Use the heat source that your doctor recommends, such as a moist heat pack or a heating pad. ? Put a towel between your skin and the heat source. ? Leave the heat on for 20-30 minutes. ? Remove the heat if your skin turns bright red. This is very important if you are unable to feel pain, heat, or cold. You may have a greater risk of getting burned. General instructions  For the first 24 hours after the procedure: ? Do not drive or use machinery. ? Do not sign important documents. ? Do not drink alcohol. ? Do your daily activities more slowly than normal. ? Eat foods that are soft and easy to digest.  Take over-the-counter or prescription medicines only as told by  your doctor.  Keep all follow-up visits as told by your doctor. This is important. Contact a doctor if:  You have blood in your poop 2-3 days after the procedure. Get help right away if:  You have more than a small amount of blood in your poop.  You see large clumps of tissue (blood clots) in your poop.  Your belly is swollen.  You feel like you may vomit (nauseous).  You vomit.  You have a fever.  You have belly pain that gets worse, and medicine does not help your pain. Summary  After the procedure, it is common to have a small amount of blood in your poop. You may also have mild cramping and bloating in your belly.  For the first 24 hours after the procedure, do not drive or use machinery, do not sign important documents, and do not drink alcohol.  Get help right away if you have a lot of blood in your poop, feel like you may vomit, have a fever, or have more belly pain. This information is not intended to replace advice given to you by your health care provider. Make sure you discuss any questions you have with your health care provider. Document Revised: 11/25/2018 Document Reviewed: 11/25/2018 Elsevier Patient Education  2020 [employer]     Monitored Anesthesia Care, Care After These instructions provide you with information about caring for   yourself after your procedure. Your health care provider may also give you more specific instructions. Your treatment has been planned according to current medical practices, but problems sometimes occur. Call your health care provider if you have any problems or questions after your procedure. What can I expect after the procedure? After your procedure, you may:  Feel sleepy for several hours.  Feel clumsy and have poor balance for several hours.  Feel forgetful about what happened after the procedure.  Have poor judgment for several hours.  Feel nauseous or vomit.  Have a sore throat if you had a breathing tube during  the procedure. Follow these instructions at home: For at least 24 hours after the procedure:      Have a responsible adult stay with you. It is important to have someone help care for you until you are awake and alert.  Rest as needed.  Do not: ? Participate in activities in which you could fall or become injured. ? Drive. ? Use heavy machinery. ? Drink alcohol. ? Take sleeping pills or medicines that cause drowsiness. ? Make important decisions or sign legal documents. ? Take care of children on your own. Eating and drinking  Follow the diet that is recommended by your health care provider.  If you vomit, drink water, juice, or soup when you can drink without vomiting.  Make sure you have little or no nausea before eating solid foods. General instructions  Take over-the-counter and prescription medicines only as told by your health care provider.  If you have sleep apnea, surgery and certain medicines can increase your risk for breathing problems. Follow instructions from your health care provider about wearing your sleep device: ? Anytime you are sleeping, including during daytime naps. ? While taking prescription pain medicines, sleeping medicines, or medicines that make you drowsy.  If you smoke, do not smoke without supervision.  Keep all follow-up visits as told by your health care provider. This is important. Contact a health care provider if:  You keep feeling nauseous or you keep vomiting.  You feel light-headed.  You develop a rash.  You have a fever. Get help right away if:  You have trouble breathing. Summary  For several hours after your procedure, you may feel sleepy and have poor judgment.  Have a responsible adult stay with you for at least 24 hours or until you are awake and alert. This information is not intended to replace advice given to you by your health care provider. Make sure you discuss any questions you have with your health care  provider. Document Revised: 07/30/2017 Document Reviewed: 08/22/2015 Elsevier Patient Education  2020 [employer] Inc.  

## 2019-10-31 NOTE — Anesthesia Preprocedure Evaluation (Signed)
Anesthesia Evaluation  Patient identified by MRN, date of birth, ID band Patient awake    Reviewed: Allergy & Precautions, H&P , NPO status , Patient's Chart, lab work & pertinent test results, reviewed documented beta blocker date and time   Airway Mallampati: II  TM Distance: >3 FB Neck ROM: full    Dental no notable dental hx.    Pulmonary neg pulmonary ROS, Current Smoker and Patient abstained from smoking.,    Pulmonary exam normal breath sounds clear to auscultation       Cardiovascular Exercise Tolerance: Good negative cardio ROS   Rhythm:regular Rate:Normal     Neuro/Psych  Neuromuscular disease negative psych ROS   GI/Hepatic negative GI ROS, Neg liver ROS,   Endo/Other  negative endocrine ROS  Renal/GU negative Renal ROS  negative genitourinary   Musculoskeletal   Abdominal   Peds  Hematology negative hematology ROS (+)   Anesthesia Other Findings   Reproductive/Obstetrics negative OB ROS                             Anesthesia Physical Anesthesia Plan  ASA: II  Anesthesia Plan: General   Post-op Pain Management:    Induction:   PONV Risk Score and Plan: 3 and Propofol infusion  Airway Management Planned:   Additional Equipment:   Intra-op Plan:   Post-operative Plan:   Informed Consent: I have reviewed the patients History and Physical, chart, labs and discussed the procedure including the risks, benefits and alternatives for the proposed anesthesia with the patient or authorized representative who has indicated his/her understanding and acceptance.     Dental Advisory Given  Plan Discussed with: CRNA  Anesthesia Plan Comments:         Anesthesia Quick Evaluation

## 2019-11-03 ENCOUNTER — Other Ambulatory Visit: Payer: Self-pay

## 2019-11-03 ENCOUNTER — Ambulatory Visit (HOSPITAL_COMMUNITY)
Admission: RE | Admit: 2019-11-03 | Discharge: 2019-11-03 | Disposition: A | Discharge status: Home / Self Care | Payer: Medicare HMO | Source: Ambulatory Visit | Attending: Family Medicine | Admitting: Family Medicine

## 2019-11-03 DIAGNOSIS — Z1231 Encounter for screening mammogram for malignant neoplasm of breast: Secondary | ICD-10-CM | POA: Insufficient documentation

## 2019-11-05 ENCOUNTER — Encounter (HOSPITAL_COMMUNITY): Payer: Self-pay | Admitting: Internal Medicine

## 2020-08-27 ENCOUNTER — Other Ambulatory Visit: Payer: Self-pay

## 2020-08-27 ENCOUNTER — Other Ambulatory Visit (HOSPITAL_COMMUNITY): Payer: Self-pay | Admitting: Family Medicine

## 2020-08-27 ENCOUNTER — Ambulatory Visit (HOSPITAL_COMMUNITY)
Admission: RE | Admit: 2020-08-27 | Discharge: 2020-08-27 | Disposition: A | Discharge status: Home / Self Care | Payer: Medicare HMO | Source: Ambulatory Visit | Attending: Family Medicine | Admitting: Family Medicine

## 2020-08-27 DIAGNOSIS — M79604 Pain in right leg: Secondary | ICD-10-CM

## 2020-08-27 DIAGNOSIS — M549 Dorsalgia, unspecified: Secondary | ICD-10-CM | POA: Diagnosis not present

## 2020-09-09 ENCOUNTER — Other Ambulatory Visit: Payer: Self-pay | Admitting: Orthopedic Surgery

## 2020-09-09 DIAGNOSIS — R2231 Localized swelling, mass and lump, right upper limb: Secondary | ICD-10-CM

## 2020-09-17 ENCOUNTER — Ambulatory Visit
Admission: RE | Admit: 2020-09-17 | Discharge: 2020-09-17 | Disposition: A | Discharge status: Home / Self Care | Payer: Medicare HMO | Source: Ambulatory Visit | Attending: Orthopedic Surgery | Admitting: Orthopedic Surgery

## 2020-09-17 ENCOUNTER — Other Ambulatory Visit: Payer: Medicare HMO

## 2020-09-17 ENCOUNTER — Other Ambulatory Visit: Payer: Self-pay

## 2020-09-17 DIAGNOSIS — R2231 Localized swelling, mass and lump, right upper limb: Secondary | ICD-10-CM

## 2020-09-27 ENCOUNTER — Other Ambulatory Visit: Payer: Self-pay | Admitting: Orthopedic Surgery

## 2020-09-29 ENCOUNTER — Other Ambulatory Visit (HOSPITAL_COMMUNITY): Payer: Self-pay | Admitting: Family Medicine

## 2020-09-29 DIAGNOSIS — Z1231 Encounter for screening mammogram for malignant neoplasm of breast: Secondary | ICD-10-CM

## 2020-10-04 ENCOUNTER — Encounter (HOSPITAL_BASED_OUTPATIENT_CLINIC_OR_DEPARTMENT_OTHER): Payer: Self-pay | Admitting: Orthopedic Surgery

## 2020-10-12 ENCOUNTER — Encounter (HOSPITAL_BASED_OUTPATIENT_CLINIC_OR_DEPARTMENT_OTHER)
Admission: RE | Disposition: A | Discharge status: Home / Self Care | Payer: Self-pay | Source: Home / Self Care | Attending: Orthopedic Surgery

## 2020-10-12 ENCOUNTER — Ambulatory Visit (HOSPITAL_BASED_OUTPATIENT_CLINIC_OR_DEPARTMENT_OTHER): Payer: Medicare HMO | Admitting: Anesthesiology

## 2020-10-12 ENCOUNTER — Encounter (HOSPITAL_BASED_OUTPATIENT_CLINIC_OR_DEPARTMENT_OTHER): Payer: Self-pay | Admitting: Orthopedic Surgery

## 2020-10-12 ENCOUNTER — Other Ambulatory Visit: Payer: Self-pay

## 2020-10-12 ENCOUNTER — Ambulatory Visit (HOSPITAL_BASED_OUTPATIENT_CLINIC_OR_DEPARTMENT_OTHER)
Admission: RE | Admit: 2020-10-12 | Discharge: 2020-10-12 | Disposition: A | Discharge status: Home / Self Care | Payer: Medicare HMO | Attending: Orthopedic Surgery | Admitting: Orthopedic Surgery

## 2020-10-12 DIAGNOSIS — I868 Varicose veins of other specified sites: Secondary | ICD-10-CM | POA: Insufficient documentation

## 2020-10-12 DIAGNOSIS — F17210 Nicotine dependence, cigarettes, uncomplicated: Secondary | ICD-10-CM | POA: Insufficient documentation

## 2020-10-12 DIAGNOSIS — M1990 Unspecified osteoarthritis, unspecified site: Secondary | ICD-10-CM | POA: Insufficient documentation

## 2020-10-12 DIAGNOSIS — R2231 Localized swelling, mass and lump, right upper limb: Secondary | ICD-10-CM | POA: Diagnosis present

## 2020-10-12 HISTORY — PX: MASS EXCISION: SHX2000

## 2020-10-12 HISTORY — DX: Unspecified osteoarthritis, unspecified site: M19.90

## 2020-10-12 HISTORY — DX: Anxiety disorder, unspecified: F41.9

## 2020-10-12 HISTORY — DX: Depression, unspecified: F32.A

## 2020-10-12 SURGERY — EXCISION MASS
Anesthesia: Monitor Anesthesia Care | Site: Hand | Laterality: Right

## 2020-10-12 MED ORDER — ALBUTEROL SULFATE (2.5 MG/3ML) 0.083% IN NEBU
2.5000 mg | INHALATION_SOLUTION | Freq: Four times a day (QID) | RESPIRATORY_TRACT | Status: DC | PRN
  Administered 2020-10-12: 2.5 mg via RESPIRATORY_TRACT

## 2020-10-12 MED ORDER — KETOROLAC TROMETHAMINE 30 MG/ML IJ SOLN: 30.0000 mg | Freq: Once | INTRAMUSCULAR | Status: DC | PRN

## 2020-10-12 MED ORDER — ALBUTEROL SULFATE HFA 108 (90 BASE) MCG/ACT IN AERS
INHALATION_SPRAY | RESPIRATORY_TRACT | Status: DC | PRN
  Administered 2020-10-12: 4 via RESPIRATORY_TRACT

## 2020-10-12 MED ORDER — LIDOCAINE HCL (PF) 0.5 % IJ SOLN
INTRAMUSCULAR | Status: DC | PRN
  Administered 2020-10-12: 30 mL via INTRAVENOUS

## 2020-10-12 MED ORDER — ONDANSETRON HCL 4 MG/2ML IJ SOLN
INTRAMUSCULAR | Status: DC | PRN
  Administered 2020-10-12: 4 mg via INTRAVENOUS

## 2020-10-12 MED ORDER — BUPIVACAINE HCL (PF) 0.25 % IJ SOLN
INTRAMUSCULAR | Status: AC
  Filled 2020-10-12: qty 90

## 2020-10-12 MED ORDER — ONDANSETRON HCL 4 MG/2ML IJ SOLN
INTRAMUSCULAR | Status: AC
  Filled 2020-10-12: qty 2

## 2020-10-12 MED ORDER — CEFAZOLIN SODIUM-DEXTROSE 2-4 GM/100ML-% IV SOLN
INTRAVENOUS | Status: AC
  Filled 2020-10-12: qty 100

## 2020-10-12 MED ORDER — PROPOFOL 500 MG/50ML IV EMUL
INTRAVENOUS | Status: DC | PRN
  Administered 2020-10-12: 50 ug/kg/min via INTRAVENOUS

## 2020-10-12 MED ORDER — OXYCODONE HCL 5 MG PO TABS: 5.0000 mg | ORAL_TABLET | Freq: Once | ORAL | Status: DC | PRN

## 2020-10-12 MED ORDER — FENTANYL CITRATE (PF) 100 MCG/2ML IJ SOLN
INTRAMUSCULAR | Status: DC | PRN
  Administered 2020-10-12: 50 ug via INTRAVENOUS

## 2020-10-12 MED ORDER — FENTANYL CITRATE (PF) 100 MCG/2ML IJ SOLN
INTRAMUSCULAR | Status: AC
  Filled 2020-10-12: qty 2

## 2020-10-12 MED ORDER — TRAMADOL HCL 50 MG PO TABS: 50.0000 mg | ORAL_TABLET | Freq: Four times a day (QID) | ORAL | 0 refills | Status: AC | PRN

## 2020-10-12 MED ORDER — MIDAZOLAM HCL 5 MG/5ML IJ SOLN
INTRAMUSCULAR | Status: DC | PRN
  Administered 2020-10-12: 2 mg via INTRAVENOUS

## 2020-10-12 MED ORDER — PROMETHAZINE HCL 25 MG/ML IJ SOLN
6.2500 mg | INTRAMUSCULAR | Status: DC | PRN
Start: 2020-10-12 — End: 2020-10-12

## 2020-10-12 MED ORDER — ACETAMINOPHEN 500 MG PO TABS
ORAL_TABLET | ORAL | Status: AC
  Filled 2020-10-12: qty 2

## 2020-10-12 MED ORDER — OXYCODONE HCL 5 MG/5ML PO SOLN: 5.0000 mg | Freq: Once | ORAL | Status: DC | PRN

## 2020-10-12 MED ORDER — CEFAZOLIN SODIUM-DEXTROSE 2-4 GM/100ML-% IV SOLN
2.0000 g | INTRAVENOUS | Status: AC
  Administered 2020-10-12: 2 g via INTRAVENOUS

## 2020-10-12 MED ORDER — PROPOFOL 500 MG/50ML IV EMUL
INTRAVENOUS | Status: AC
  Filled 2020-10-12: qty 50

## 2020-10-12 MED ORDER — BUPIVACAINE HCL (PF) 0.25 % IJ SOLN
INTRAMUSCULAR | Status: DC | PRN
  Administered 2020-10-12: 3 mL

## 2020-10-12 MED ORDER — MIDAZOLAM HCL 2 MG/2ML IJ SOLN
INTRAMUSCULAR | Status: AC
  Filled 2020-10-12: qty 2

## 2020-10-12 MED ORDER — LACTATED RINGERS IV SOLN: INTRAVENOUS | Status: DC

## 2020-10-12 MED ORDER — ALBUTEROL SULFATE HFA 108 (90 BASE) MCG/ACT IN AERS
INHALATION_SPRAY | RESPIRATORY_TRACT | Status: AC
  Filled 2020-10-12: qty 6.7

## 2020-10-12 MED ORDER — HYDROMORPHONE HCL 1 MG/ML IJ SOLN: 0.2500 mg | INTRAMUSCULAR | Status: DC | PRN

## 2020-10-12 MED ORDER — ACETAMINOPHEN 500 MG PO TABS
1000.0000 mg | ORAL_TABLET | Freq: Once | ORAL | Status: AC
  Administered 2020-10-12: 1000 mg via ORAL

## 2020-10-12 MED ORDER — ALBUTEROL SULFATE (2.5 MG/3ML) 0.083% IN NEBU
INHALATION_SOLUTION | RESPIRATORY_TRACT | Status: AC
  Filled 2020-10-12: qty 3

## 2020-10-12 SURGICAL SUPPLY — 45 items
APL PRP STRL LF DISP 70% ISPRP (MISCELLANEOUS) ×1
BLADE SURG 15 STRL LF DISP TIS (BLADE) ×1 IMPLANT
BLADE SURG 15 STRL SS (BLADE) ×2
BNDG CMPR 9X4 STRL LF SNTH (GAUZE/BANDAGES/DRESSINGS) ×1
BNDG COHESIVE 1X5 TAN STRL LF (GAUZE/BANDAGES/DRESSINGS) IMPLANT
BNDG COHESIVE 2X5 TAN STRL LF (GAUZE/BANDAGES/DRESSINGS) ×1 IMPLANT
BNDG COHESIVE 3X5 TAN STRL LF (GAUZE/BANDAGES/DRESSINGS) IMPLANT
BNDG ESMARK 4X9 LF (GAUZE/BANDAGES/DRESSINGS) ×1 IMPLANT
BNDG GAUZE ELAST 4 BULKY (GAUZE/BANDAGES/DRESSINGS) ×1 IMPLANT
CHLORAPREP W/TINT 26 (MISCELLANEOUS) ×2 IMPLANT
CORD BIPOLAR FORCEPS 12FT (ELECTRODE) ×2 IMPLANT
COVER BACK TABLE 60X90IN (DRAPES) ×2 IMPLANT
COVER MAYO STAND STRL (DRAPES) ×2 IMPLANT
COVER WAND RF STERILE (DRAPES) IMPLANT
CUFF TOURN SGL QUICK 18X4 (TOURNIQUET CUFF) ×1 IMPLANT
DECANTER SPIKE VIAL GLASS SM (MISCELLANEOUS) IMPLANT
DRAIN PENROSE 1/2X12 LTX STRL (WOUND CARE) IMPLANT
DRAPE EXTREMITY T 121X128X90 (DISPOSABLE) ×2 IMPLANT
DRAPE SURG 17X23 STRL (DRAPES) ×2 IMPLANT
GAUZE SPONGE 4X4 12PLY STRL (GAUZE/BANDAGES/DRESSINGS) ×2 IMPLANT
GAUZE XEROFORM 1X8 LF (GAUZE/BANDAGES/DRESSINGS) ×2 IMPLANT
GLOVE SURG ORTHO LTX SZ8 (GLOVE) ×2 IMPLANT
GLOVE SURG POLYISO LF SZ8 (GLOVE) ×1 IMPLANT
GLOVE SURG UNDER POLY LF SZ8.5 (GLOVE) ×2 IMPLANT
GOWN STRL REUS W/ TWL LRG LVL3 (GOWN DISPOSABLE) ×1 IMPLANT
GOWN STRL REUS W/TWL 2XL LVL3 (GOWN DISPOSABLE) ×1 IMPLANT
GOWN STRL REUS W/TWL LRG LVL3 (GOWN DISPOSABLE)
GOWN STRL REUS W/TWL XL LVL3 (GOWN DISPOSABLE) ×2 IMPLANT
NDL PRECISIONGLIDE 27X1.5 (NEEDLE) ×1 IMPLANT
NDL SAFETY ECLIPSE 18X1.5 (NEEDLE) IMPLANT
NEEDLE HYPO 18GX1.5 SHARP (NEEDLE)
NEEDLE PRECISIONGLIDE 27X1.5 (NEEDLE) ×2 IMPLANT
NS IRRIG 1000ML POUR BTL (IV SOLUTION) ×2 IMPLANT
PACK BASIN DAY SURGERY FS (CUSTOM PROCEDURE TRAY) ×2 IMPLANT
PAD CAST 3X4 CTTN HI CHSV (CAST SUPPLIES) IMPLANT
PADDING CAST COTTON 3X4 STRL (CAST SUPPLIES)
SPLINT PLASTER CAST XFAST 3X15 (CAST SUPPLIES) IMPLANT
SPLINT PLASTER XTRA FASTSET 3X (CAST SUPPLIES)
STOCKINETTE 4X48 STRL (DRAPES) ×2 IMPLANT
SUT ETHILON 4 0 PS 2 18 (SUTURE) ×2 IMPLANT
SUT VIC AB 4-0 P2 18 (SUTURE) IMPLANT
SYR BULB EAR ULCER 3OZ GRN STR (SYRINGE) ×2 IMPLANT
SYR CONTROL 10ML LL (SYRINGE) ×2 IMPLANT
TOWEL GREEN STERILE FF (TOWEL DISPOSABLE) ×2 IMPLANT
UNDERPAD 30X36 HEAVY ABSORB (UNDERPADS AND DIAPERS) ×2 IMPLANT

## 2020-10-12 NOTE — Anesthesia Procedure Notes (Signed)
Anesthesia Regional Block: Bier block (IV Regional)   Pre-Anesthetic Checklist: ,, timeout performed, Correct Patient, Correct Site, Correct Laterality, Correct Procedure, Correct Position, site marked, Risks and benefits discussed,  Surgical consent,  Pre-op evaluation,  At surgeon's request and post-op pain management  Laterality: Right  Prep: alcohol swabs        Procedures:,,,,, intact distal pulses, Esmarch exsanguination, single tourniquet utilized, #20gu IV placed  Narrative:  CRNA: Verita Lamb, CRNA

## 2020-10-12 NOTE — Discharge Instructions (Addendum)
[facility] Instructions Hand Surgery  Wound Care: Keep your hand elevated above the level of your heart.  Do not allow it to dangle by your side.  Keep the dressing dry and do not remove it unless your doctor advises you to do so.  He will usually change it at the time of your post-op visit.  Moving your fingers is advised to stimulate circulation but will depend on the site of your surgery.  If you have a splint applied, your doctor will advise you regarding movement.  Activity: Do not drive or operate machinery today.  Rest today and then you may return to your normal activity and work as indicated by your physician.  Diet:  Drink liquids today or eat a light diet.  You may resume a regular diet tomorrow.    General expectations: Pain for two to three days. Fingers may become slightly swollen.  Call your doctor if any of the following occur: Severe pain not relieved by pain medication. Elevated temperature. Dressing soaked with blood. Inability to move fingers. White or bluish color to fingers.  May take Tylenol after 2pm, if needed.    Post Anesthesia Home Care Instructions  Activity: Get plenty of rest for the remainder of the day. A responsible individual must stay with you for 24 hours following the procedure.  For the next 24 hours, DO NOT: -Drive a car -Paediatric nurse -Drink alcoholic beverages -Take any medication unless instructed by your physician -Make any legal decisions or sign important papers.  Meals: Start with liquid foods such as gelatin or soup. Progress to regular foods as tolerated. Avoid greasy, spicy, heavy foods. If nausea and/or vomiting occur, drink only clear liquids until the nausea and/or vomiting subsides. Call your physician if vomiting continues.  Special Instructions/Symptoms: Your throat may feel dry or sore from the anesthesia or the breathing tube placed in your throat during surgery. If this causes discomfort, gargle with warm salt  water. The discomfort should disappear within 24 hours.  If you had a scopolamine patch placed behind your ear for the management of post- operative nausea and/or vomiting:  1. The medication in the patch is effective for 72 hours, after which it should be removed.  Wrap patch in a tissue and discard in the trash. Wash hands thoroughly with soap and water. 2. You may remove the patch earlier than 72 hours if you experience unpleasant side effects which may include dry mouth, dizziness or visual disturbances. 3. Avoid touching the patch. Wash your hands with soap and water after contact with the patch.

## 2020-10-12 NOTE — Brief Op Note (Signed)
10/12/2020  10:44 AM  PATIENT:  Valerie Gibbs  70 y.o. female  PRE-OPERATIVE DIAGNOSIS:  RIGHT HAND MASS  POST-OPERATIVE DIAGNOSIS:  RIGHT HAND MASS  PROCEDURE:  Procedure(s) with comments: EXCISIONAL BIOPSY MASS RIGHT HAND (Right) - IV REGIONAL FOREARM BLOCK  SURGEON:  Surgeon(s) and Role:    * Daryll Brod, MD - Primary  PHYSICIAN ASSISTANT:   ASSISTANTS: none   ANESTHESIA:   local, regional and IV sedation  EBL:  5 mL   BLOOD ADMINISTERED:none  DRAINS: none   LOCAL MEDICATIONS USED:  BUPIVICAINE   SPECIMEN:  Excision  DISPOSITION OF SPECIMEN:  PATHOLOGY  COUNTS:  YES  TOURNIQUET:   Total Tourniquet Time Documented: Forearm (Right) - 23 minutes Total: Forearm (Right) - 23 minutes   DICTATION: .Viviann Spare Dictation  PLAN OF CARE: Discharge to home after PACU  PATIENT DISPOSITION:  PACU - hemodynamically stable.

## 2020-10-12 NOTE — Anesthesia Postprocedure Evaluation (Signed)
Anesthesia Post Note  Patient: Valerie Gibbs  Procedure(s) Performed: EXCISIONAL BIOPSY MASS RIGHT HAND (Right Hand)     Patient location during evaluation: PACU Anesthesia Type: MAC Level of consciousness: awake and alert Pain management: pain level controlled Vital Signs Assessment: post-procedure vital signs reviewed and stable Respiratory status: spontaneous breathing, nonlabored ventilation and respiratory function stable Cardiovascular status: blood pressure returned to baseline and stable Postop Assessment: no apparent nausea or vomiting Anesthetic complications: no   No complications documented.  Last Vitals:  Vitals:   10/12/20 1215 10/12/20 1230  BP:    Pulse: 89 90  Resp:    Temp:    SpO2: 97% 92%    Last Pain:  Vitals:   10/12/20 1200  TempSrc:   PainSc: 0-No pain                 Lynda Rainwater

## 2020-10-12 NOTE — Anesthesia Preprocedure Evaluation (Addendum)
Anesthesia Evaluation  Patient identified by MRN, date of birth, ID band Patient awake    Reviewed: Allergy & Precautions, NPO status , Patient's Chart, lab work & pertinent test results  Airway Mallampati: II  TM Distance: >3 FB Neck ROM: Full    Dental  (+) Poor Dentition, Missing, Chipped, Dental Advisory Given,  Extremely poor dentition throughout, pt denies any loose teeth :   Pulmonary Current Smoker and Patient abstained from smoking.,  10 cigg/d, 1 ppd x 40 years  Got albuterol inhaler about a week ago, used it last yesterday    Pulmonary exam normal breath sounds clear to auscultation       Cardiovascular negative cardio ROS Normal cardiovascular exam Rhythm:Regular Rate:Normal     Neuro/Psych PSYCHIATRIC DISORDERS Anxiety Depression negative neurological ROS     GI/Hepatic negative GI ROS, Neg liver ROS,   Endo/Other  negative endocrine ROS  Renal/GU negative Renal ROS  negative genitourinary   Musculoskeletal  (+) Arthritis , Osteoarthritis,  Fibromyalgia -Right hand mass   Abdominal   Peds  Hematology negative hematology ROS (+)   Anesthesia Other Findings   Reproductive/Obstetrics negative OB ROS                            Anesthesia Physical Anesthesia Plan  ASA: II  Anesthesia Plan: MAC and Bier Block and Bier Block-LIDOCAINE ONLY   Post-op Pain Management:    Induction:   PONV Risk Score and Plan: 2 and Propofol infusion and TIVA  Airway Management Planned: Natural Airway and Simple Face Mask  Additional Equipment: None  Intra-op Plan:   Post-operative Plan:   Informed Consent: I have reviewed the patients History and Physical, chart, labs and discussed the procedure including the risks, benefits and alternatives for the proposed anesthesia with the patient or authorized representative who has indicated his/her understanding and acceptance.       Plan  Discussed with: CRNA  Anesthesia Plan Comments:        Anesthesia Quick Evaluation

## 2020-10-12 NOTE — Op Note (Signed)
NAME: Valerie Gibbs MEDICAL RECORD NO: [id number] DATE OF BIRTH: 08-17-1950 FACILITY: [facility] LOCATION: [facility] PHYSICIAN: Wynonia Sours, MD   OPERATIVE REPORT   DATE OF PROCEDURE: 10/12/20    PREOPERATIVE DIAGNOSIS:   Mass right hand   POSTOPERATIVE DIAGNOSIS:   Same   PROCEDURE:   Excision mass right hand   SURGEON: Daryll Brod, M.D.   ASSISTANT: none   ANESTHESIA:  Bier block with sedation and Local   INTRAVENOUS FLUIDS:  Per anesthesia flow sheet.   ESTIMATED BLOOD LOSS:  Minimal.   COMPLICATIONS:  None.   SPECIMENS:   Mass   TOURNIQUET TIME:    Total Tourniquet Time Documented: Forearm (Right) - 23 minutes Total: Forearm (Right) - 23 minutes    DISPOSITION:  Stable to PACU.   INDICATIONS: Patient is a 70 year old female with a history of a mass enlarging on the interspace between the ring and small fingers of her right hand.  She is desirous having this removed.  Has a bluish-purple tint to it.  It is nonpainful unless she hits the area.  Pre-.  Postoperative course been discussed along with risk and complications.  She is aware that there is no guarantee to the surgery the possibility of infection recurrence injury to arteries nerves tendons incomplete relief of symptoms dystrophy.  In the preoperative area the patient is seen the extremity marked by both patient and surgeon antibiotic given  OPERATIVE COURSE: Patient is brought to the operating room where a forearm-based IV regional anesthetic was carried out without difficulty under the direction the anesthesia department after placing a supine position with the right arm free.  Prep was done with ChloraPrep and a 3-minute dry time allowed timeout taken to confirm patient procedure was done a longitudinal incision was made directly over the mass carried down through subcutaneous tissue.  A multilobulated bluish blood-filled mass was immediately encountered.  This was dissected free from surrounding  tissue with blunt sharp dissection.  A feeding vessel was immediately apparent.  This cortex was coagulated with bipolar as was the egressed vessel.  The specimen was removed and sent to pathology measuring approximately 1.2 x 1 cm in diameter.  The wound was copious irrigated with saline.  The skin was closed interrupted 4-0 nylon sutures.  Local infiltration quarter percent bupivacaine without epinephrine was given approximately 5 cc was used.  Sterile compressive dressing was applied.  Inflation of the tourniquet all fingers immediately pink.  She was taken to the recovery room for observation in satisfactory condition.  She will be discharged home to return to the [facility] center of [facility] in 1 week on Tylenol ibuprofen for pain with Ultram for breakthrough.   Daryll Brod, MD Electronically signed, 10/12/20

## 2020-10-12 NOTE — H&P (Signed)
  Valerie Gibbs is an 70 y.o. female.   Chief Complaint: mass right hand HPI: Valerie Gibbs is a 70 year old referred by Dr.Knowlton for consultation regarding a mass in her right hand. She states is been present for years. Is not causing any pain or discomfort. She states that she is just tired of having it there. She is going to concerned about its color. It has not significantly enlarged. She has no history of injury. She has a history of arthritis no history of diabetes thyroid problems or gout. Family history is negative for each.     Past Medical History:  Diagnosis Date  . Anxiety   . Arthritis   . Depression   . Fibromyalgia   . Hyperlipidemia   . S/P appendectomy   . S/P cholecystectomy   . S/P hysterectomy     Past Surgical History:  Procedure Laterality Date  . APPENDECTOMY    . CHOLECYSTECTOMY    . COLONOSCOPY N/A 10/07/2014   Procedure: COLONOSCOPY;  Surgeon: Rogene Houston, MD;  Location: [facility];  Service: Endoscopy;  Laterality: N/A;  930  . COLONOSCOPY WITH PROPOFOL N/A 10/31/2019   Procedure: COLONOSCOPY WITH PROPOFOL;  Surgeon: Rogene Houston, MD;  Location: [facility];  Service: Endoscopy;  Laterality: N/A;  1    Family History  Problem Relation Age of Onset  . Breast cancer Mother   . Cancer Mother   . Heart attack Father   . Coronary artery disease Sister   . Hyperlipidemia Sister   . Hypertension Brother   . Chronic bronchitis Brother   . Hyperlipidemia Brother    Social History:  reports that she has been smoking cigarettes. She has been smoking about 0.25 packs per day. She has never used smokeless tobacco. She reports current alcohol use. She reports that she does not use drugs.  Allergies: No Known Allergies  No medications prior to admission.    No results found for this or any previous visit (from the past 48 hour(s)).  No results found.   Pertinent items are noted in HPI.  Height 5' 0.5" (1.537 m), weight 62.1 kg.  General  appearance: alert, cooperative and appears stated age Head: Normocephalic, without obvious abnormality Neck: no JVD Resp: clear to auscultation bilaterally Cardio: regular rate and rhythm, S1, S2 normal, no murmur, click, rub or gallop GI: soft, non-tender; bowel sounds normal; no masses,  no organomegaly Extremities: mass right hand Pulses: 2+ and symmetric Skin: Skin color, texture, turgor normal. No rashes or lesions Neurologic: Grossly normal Incision/Wound: na  Assessment/Plan Soft tissue tumor right hand  Plan: This may well be a vascular tumor. Her notes from Dr. Lucas Mallow are reviewed. We will schedule her for ultrasound to examine exactly its location whether blood vessels entering and exiting from this. I will see her back following that.     Daryll Brod 10/12/2020, 5:58 AM

## 2020-10-12 NOTE — Transfer of Care (Signed)
Immediate Anesthesia Transfer of Care Note  Patient: Valerie Gibbs  Procedure(s) Performed: EXCISIONAL BIOPSY MASS RIGHT HAND (Right Hand)  Patient Location: PACU  Anesthesia Type:MAC and Bier block  Level of Consciousness: awake, alert  and oriented  Airway & Oxygen Therapy: Patient Spontanous Breathing and Patient connected to face mask oxygen  Post-op Assessment: Report given to RN and Post -op Vital signs reviewed and stable  Post vital signs: Reviewed and stable  Last Vitals:  Vitals Value Taken Time  BP    Temp    Pulse 92 10/12/20 1039  Resp    SpO2 99 % 10/12/20 1039  Vitals shown include unvalidated device data.  Last Pain:  Vitals:   10/12/20 0824  TempSrc: Oral  PainSc: 0-No pain         Complications: No complications documented.

## 2020-10-13 LAB — SURGICAL PATHOLOGY

## 2020-10-14 ENCOUNTER — Encounter (HOSPITAL_BASED_OUTPATIENT_CLINIC_OR_DEPARTMENT_OTHER): Payer: Self-pay | Admitting: Orthopedic Surgery

## 2020-11-04 ENCOUNTER — Ambulatory Visit (HOSPITAL_COMMUNITY): Payer: Medicare HMO

## 2020-11-05 ENCOUNTER — Other Ambulatory Visit: Payer: Self-pay

## 2020-11-05 ENCOUNTER — Ambulatory Visit (HOSPITAL_COMMUNITY)
Admission: RE | Admit: 2020-11-05 | Discharge: 2020-11-05 | Disposition: A | Discharge status: Home / Self Care | Payer: Medicare HMO | Source: Ambulatory Visit | Attending: Family Medicine | Admitting: Family Medicine

## 2020-11-05 DIAGNOSIS — Z1231 Encounter for screening mammogram for malignant neoplasm of breast: Secondary | ICD-10-CM | POA: Insufficient documentation

## 2021-05-08 IMAGING — MG DIGITAL SCREENING BILAT W/ TOMO W/ CAD
8 series · 9 of 24 positions shown · non-contrast
Comparison: Previous exam(s).

CLINICAL DATA: Screening.

EXAM:
DIGITAL SCREENING BILATERAL MAMMOGRAM WITH TOMO AND CAD

[R MLO synth-2D]
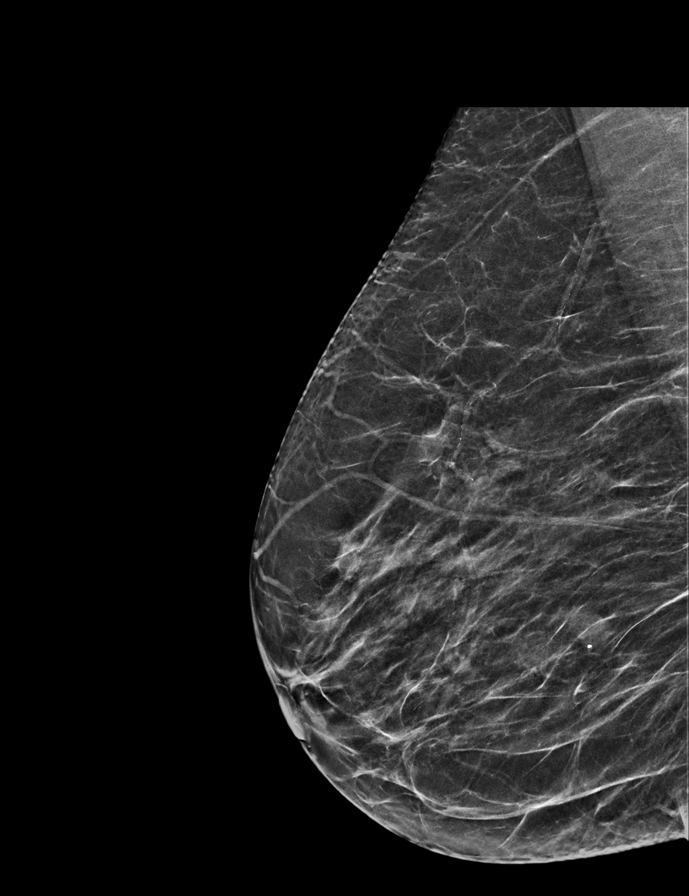

[L MLO synth-2D]
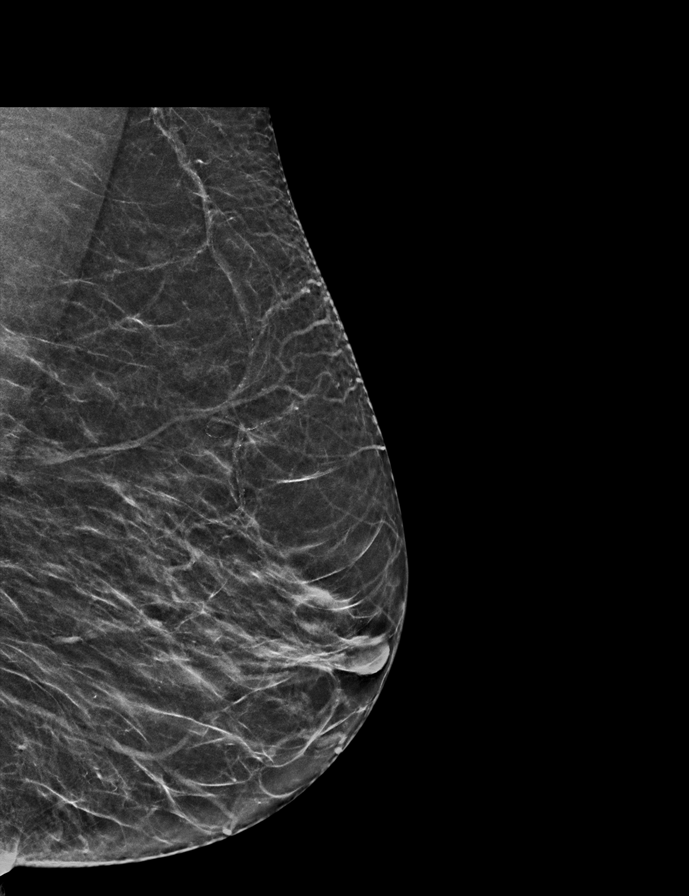

[R CC synth-2D]
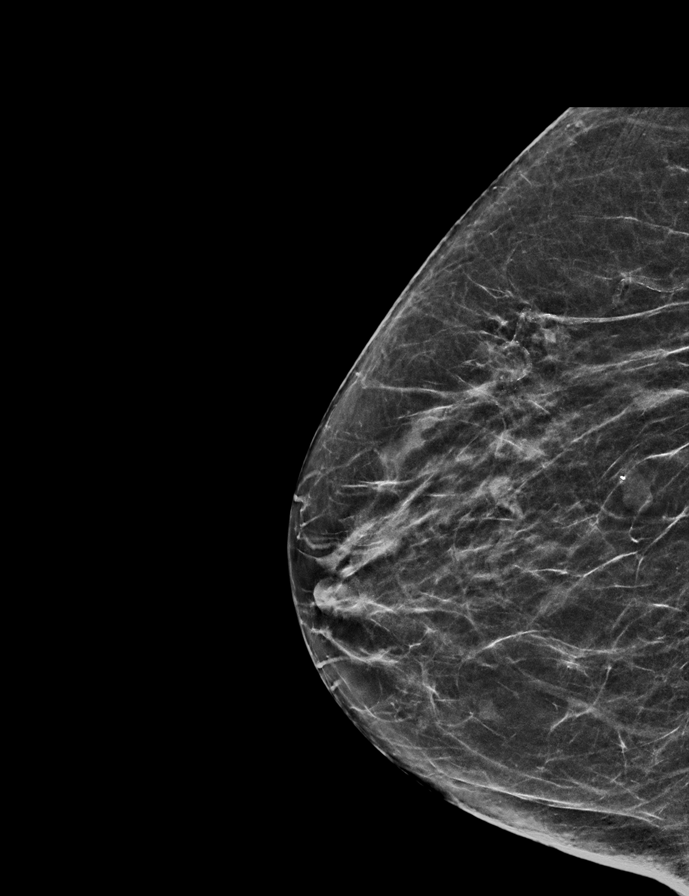

[L CC synth-2D]
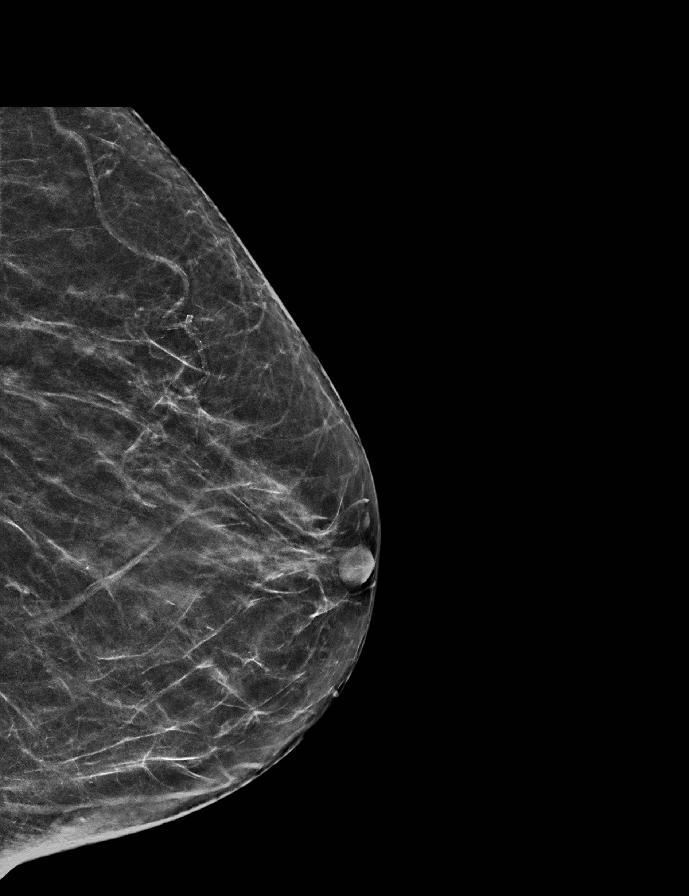

[L MLO tomo · 2 of 53 frames shown]
[frame 18/53]
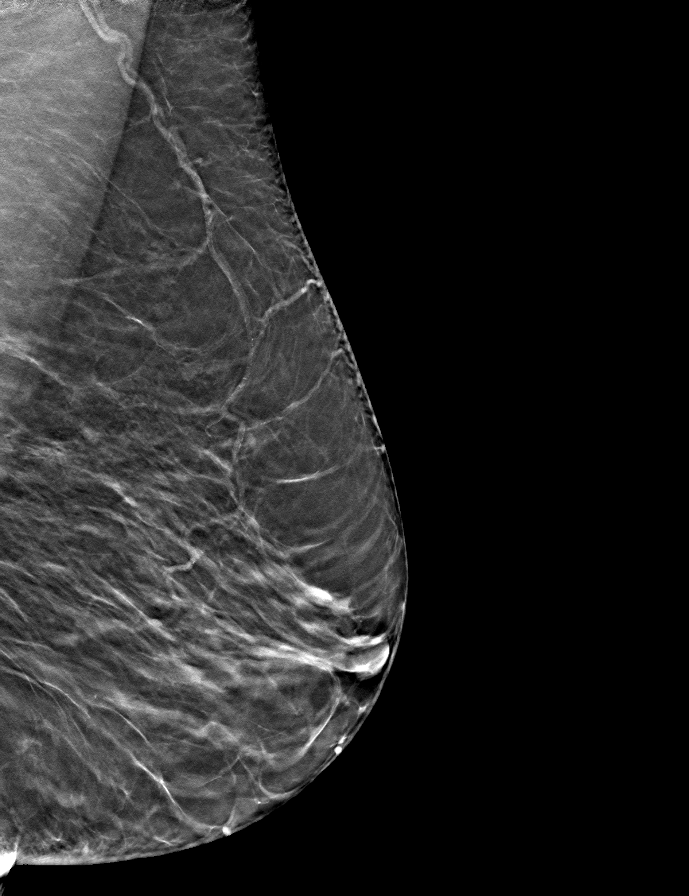
[frame 27/53]
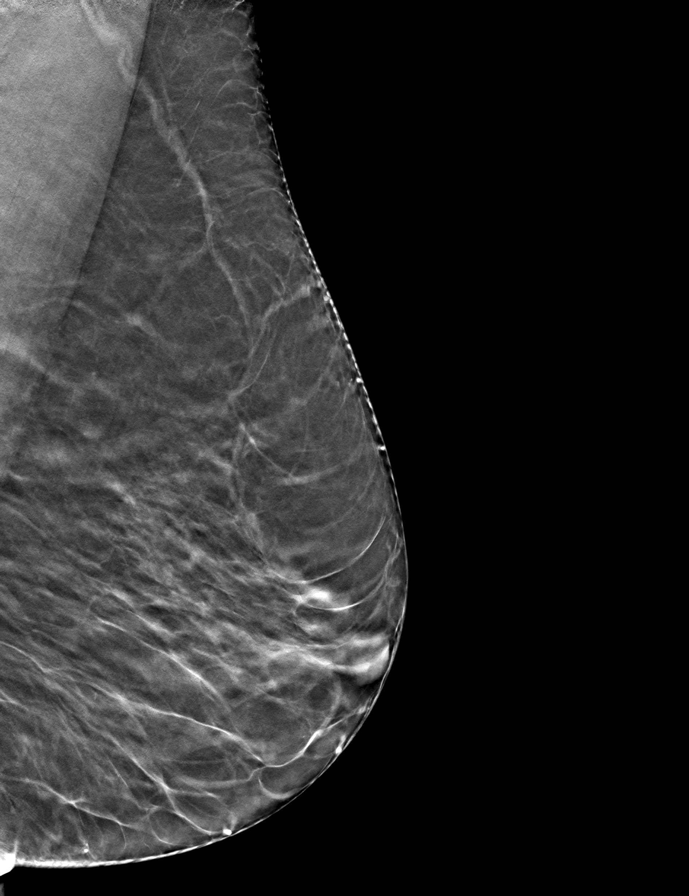

[R CC tomo · tomo slice 27/54.0]
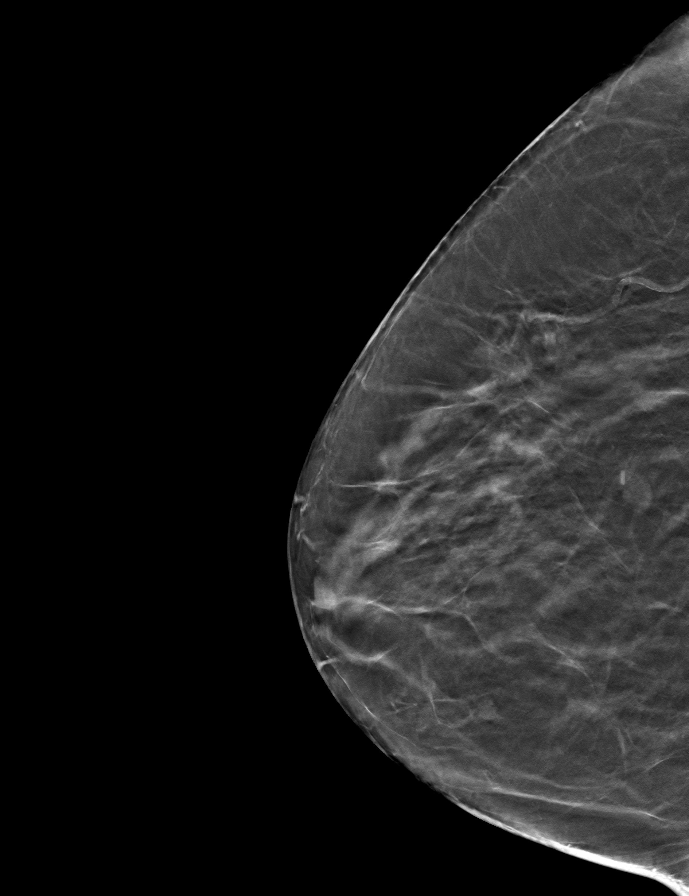

[L CC tomo · tomo slice 27/54.0]
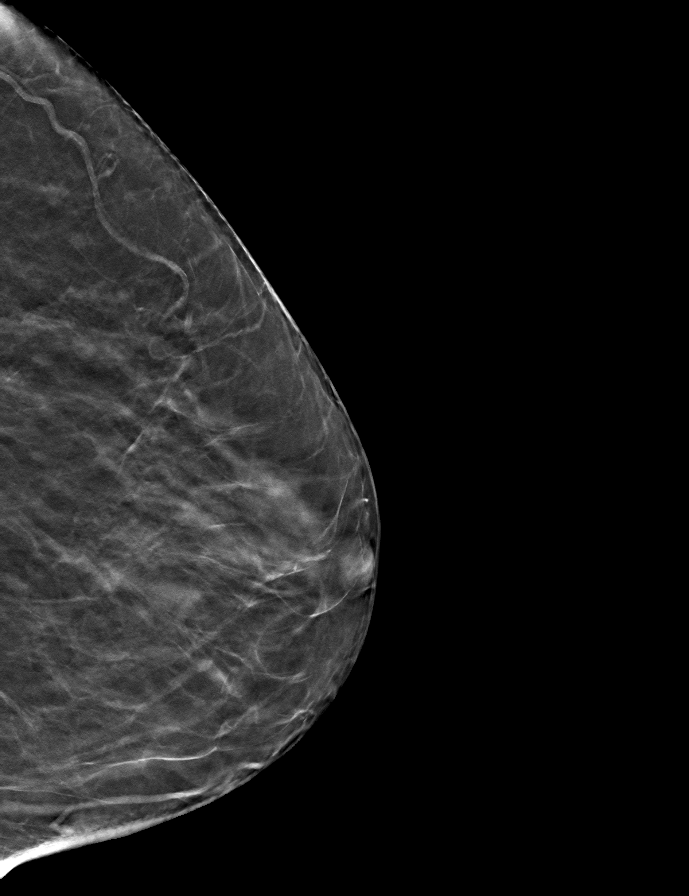

[R MLO tomo · tomo slice 27/52.0]
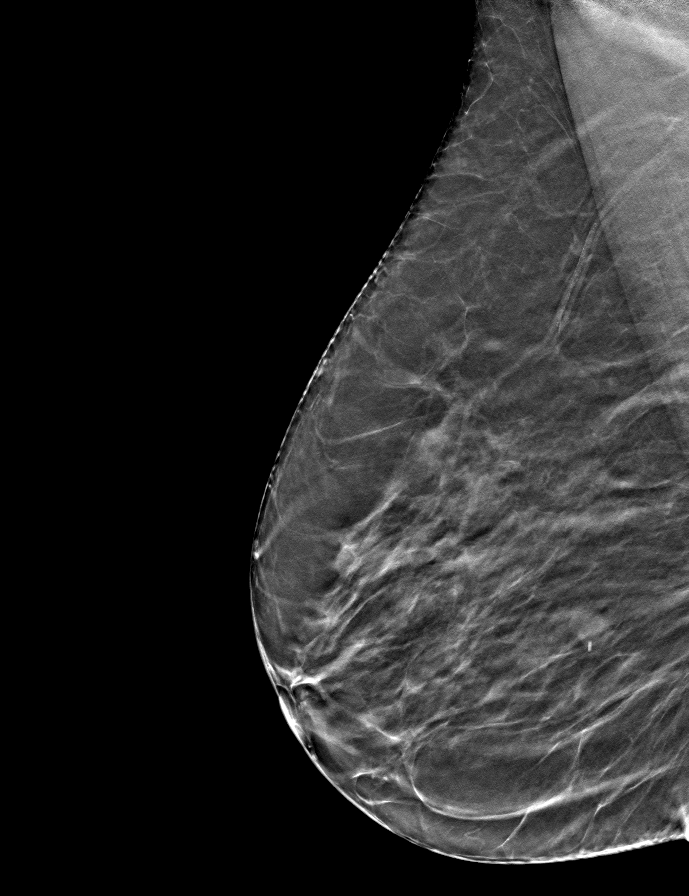

[9 of 24 positions shown; findings below may reference images not displayed]

ACR Breast Density Category b: There are scattered areas of
fibroglandular density.
FINDINGS: There are no findings suspicious for malignancy. Images were
processed with CAD.
IMPRESSION: No mammographic evidence of malignancy. A result letter of this
screening mammogram will be mailed directly to the patient.

RECOMMENDATION:
Screening mammogram in one year. (Code:[id number])

BI-RADS CATEGORY  1: Negative.

## 2021-09-29 ENCOUNTER — Other Ambulatory Visit (HOSPITAL_COMMUNITY): Payer: Self-pay | Admitting: Family Medicine

## 2021-09-29 DIAGNOSIS — Z1231 Encounter for screening mammogram for malignant neoplasm of breast: Secondary | ICD-10-CM

## 2021-11-07 ENCOUNTER — Ambulatory Visit (HOSPITAL_COMMUNITY)
Admission: RE | Admit: 2021-11-07 | Discharge: 2021-11-07 | Disposition: A | Discharge status: Home / Self Care | Payer: Medicare PPO | Source: Ambulatory Visit | Attending: Family Medicine | Admitting: Family Medicine

## 2021-11-07 DIAGNOSIS — Z1231 Encounter for screening mammogram for malignant neoplasm of breast: Secondary | ICD-10-CM | POA: Insufficient documentation

## 2021-11-16 ENCOUNTER — Other Ambulatory Visit (HOSPITAL_COMMUNITY): Payer: Self-pay | Admitting: Nurse Practitioner

## 2021-11-16 DIAGNOSIS — Z780 Asymptomatic menopausal state: Secondary | ICD-10-CM

## 2021-12-06 ENCOUNTER — Ambulatory Visit (HOSPITAL_COMMUNITY)
Admission: RE | Admit: 2021-12-06 | Discharge: 2021-12-06 | Disposition: A | Discharge status: Home / Self Care | Payer: Medicare PPO | Source: Ambulatory Visit | Attending: Nurse Practitioner | Admitting: Nurse Practitioner

## 2021-12-06 DIAGNOSIS — Z780 Asymptomatic menopausal state: Secondary | ICD-10-CM | POA: Insufficient documentation

## 2022-10-02 ENCOUNTER — Other Ambulatory Visit (HOSPITAL_COMMUNITY): Payer: Self-pay | Admitting: Family Medicine

## 2022-10-02 DIAGNOSIS — Z1231 Encounter for screening mammogram for malignant neoplasm of breast: Secondary | ICD-10-CM

## 2022-11-10 ENCOUNTER — Ambulatory Visit (HOSPITAL_COMMUNITY)
Admission: RE | Admit: 2022-11-10 | Discharge: 2022-11-10 | Disposition: A | Discharge status: Home / Self Care | Payer: Medicare Other | Source: Ambulatory Visit | Attending: Family Medicine | Admitting: Family Medicine

## 2022-11-10 DIAGNOSIS — Z1231 Encounter for screening mammogram for malignant neoplasm of breast: Secondary | ICD-10-CM | POA: Insufficient documentation

## 2023-02-27 ENCOUNTER — Other Ambulatory Visit (HOSPITAL_COMMUNITY): Payer: Self-pay | Admitting: Family Medicine

## 2023-02-27 DIAGNOSIS — E782 Mixed hyperlipidemia: Secondary | ICD-10-CM

## 2023-04-03 ENCOUNTER — Ambulatory Visit (HOSPITAL_COMMUNITY)
Admission: RE | Admit: 2023-04-03 | Discharge: 2023-04-03 | Disposition: A | Discharge status: Home / Self Care | Payer: Medicare Other | Source: Ambulatory Visit | Attending: Family Medicine | Admitting: Family Medicine

## 2023-04-03 DIAGNOSIS — E782 Mixed hyperlipidemia: Secondary | ICD-10-CM | POA: Insufficient documentation

## 2023-08-13 DIAGNOSIS — R7303 Prediabetes: Secondary | ICD-10-CM | POA: Diagnosis not present

## 2023-08-13 DIAGNOSIS — E782 Mixed hyperlipidemia: Secondary | ICD-10-CM | POA: Diagnosis not present

## 2023-08-13 DIAGNOSIS — E559 Vitamin D deficiency, unspecified: Secondary | ICD-10-CM | POA: Diagnosis not present

## 2023-08-15 ENCOUNTER — Ambulatory Visit: Payer: Medicare Other | Attending: Internal Medicine | Admitting: Internal Medicine

## 2023-08-15 ENCOUNTER — Encounter: Payer: Self-pay | Admitting: Internal Medicine

## 2023-08-15 VITALS — BP 112/82 | HR 109 | Ht 61.5 in | Wt 134.0 lb

## 2023-08-15 DIAGNOSIS — I700 Atherosclerosis of aorta: Secondary | ICD-10-CM

## 2023-08-15 DIAGNOSIS — E785 Hyperlipidemia, unspecified: Secondary | ICD-10-CM | POA: Insufficient documentation

## 2023-08-15 DIAGNOSIS — R931 Abnormal findings on diagnostic imaging of heart and coronary circulation: Secondary | ICD-10-CM

## 2023-08-15 DIAGNOSIS — R0609 Other forms of dyspnea: Secondary | ICD-10-CM | POA: Diagnosis not present

## 2023-08-15 DIAGNOSIS — E7849 Other hyperlipidemia: Secondary | ICD-10-CM

## 2023-08-15 DIAGNOSIS — Z720 Tobacco use: Secondary | ICD-10-CM

## 2023-08-15 NOTE — Patient Instructions (Signed)
 Medication Instructions:  Your physician recommends that you continue on your current medications as directed. Please refer to the Current Medication list given to you today.  *If you need a refill on your cardiac medications before your next appointment, please call your pharmacy*  Lab Work: None If you have labs (blood work) drawn today and your tests are completely normal, you will receive your results only by: MyChart Message (if you have MyChart) OR A paper copy in the mail If you have any lab test that is abnormal or we need to change your treatment, we will call you to review the results.  Testing/Procedures: Your physician has requested that you have a lexiscan myoview. For further information please visit [email or web address] Please follow instruction sheet, as given.  Your physician has requested that you have an echocardiogram. Echocardiography is a painless test that uses sound waves to create images of your heart. It provides your doctor with information about the size and shape of your heart and how well your heart's chambers and valves are working. This procedure takes approximately one hour. There are no restrictions for this procedure. Please do NOT wear cologne, perfume, aftershave, or lotions (deodorant is allowed). Please arrive 15 minutes prior to your appointment time.  Please note: We ask at that you not bring children with you during ultrasound (echo/ vascular) testing. Due to room size and safety concerns, children are not allowed in the ultrasound rooms during exams. Our front office staff cannot provide observation of children in our lobby area while testing is being conducted. An adult accompanying a patient to their appointment will only be allowed in the ultrasound room at the discretion of the ultrasound technician under special circumstances. We apologize for any inconvenience.   Follow-Up: At [facility], you and your health needs are our priority.   As part of our continuing mission to provide you with exceptional heart care, our providers are all part of one team.  This team includes your primary Cardiologist (physician) and Advanced Practice Providers or APPs (Physician Assistants and Nurse Practitioners) who all work together to provide you with the care you need, when you need it.  Your next appointment:    Follow up to be determined following testing results  Provider:   You may see Vishnu P Mallipeddi, MD or one of the following Advanced Practice Providers on your designated Care Team:   [place], PA-C  [place], [place] Jacolyn Reedy, [place]     We recommend signing up for the patient portal called "MyChart".  Sign up information is provided on this After Visit Summary.  MyChart is used to connect with patients for Virtual Visits (Telemedicine).  Patients are able to view lab/test results, encounter notes, upcoming appointments, etc.  Non-urgent messages can be sent to your provider as well.   To learn more about what you can do with MyChart, go to [email or web address].   Other Instructions

## 2023-08-15 NOTE — Progress Notes (Signed)
 Cardiology Office Note  Date: 08/15/2023   ID: Valerie Gibbs, DOB [birth year], MRN [id number]  PCP:  Gareth Morgan, MD  Cardiologist:  Marjo Bicker, MD Electrophysiologist:  None   History of Present Illness: Valerie Gibbs is a 73 y.o. female known to have nicotine abuse, HLD was referred to cardiology clinic for evaluation of elevated coronary calcium score and DOE.  Ongoing DOE that she noticed recently.  Occurs with daily activities.  Smokes 1 pack/day.  Trying hard to quit.  No angina, dizziness, syncope, palpitations and leg swelling.  Coronary calcium score is 931, 96 percentile for age and sex matched control in November 2024.  Past Medical History:  Diagnosis Date   Anxiety    Arthritis    Depression    Fibromyalgia    Hyperlipidemia    S/P appendectomy    S/P cholecystectomy    S/P hysterectomy     Past Surgical History:  Procedure Laterality Date   APPENDECTOMY     CHOLECYSTECTOMY     COLONOSCOPY N/A 10/07/2014   Procedure: COLONOSCOPY;  Surgeon: Malissa Hippo, MD;  [facility];  Service: Endoscopy;  Laterality: N/A;  930   COLONOSCOPY WITH PROPOFOL N/A 10/31/2019   Procedure: COLONOSCOPY WITH PROPOFOL;  Surgeon: Malissa Hippo, MD;  Location: [facility];  Service: Endoscopy;  Laterality: N/A;  830   MASS EXCISION Right 10/12/2020   Procedure: EXCISIONAL BIOPSY MASS RIGHT HAND;  Surgeon: Cindee Salt, MD;  Location: [facility];  Service: Orthopedics;  Laterality: Right;  IV REGIONAL FOREARM BLOCK    Current Outpatient Medications  Medication Sig Dispense Refill   albuterol (VENTOLIN) (5 MG/ML) 0.5% NEBU Take by nebulization continuous.     aspirin EC 81 MG tablet Take 81 mg by mouth daily.     atorvastatin (LIPITOR) 80 MG tablet Take 80 mg by mouth at bedtime.      clonazePAM (KLONOPIN) 1 MG tablet Take 1 mg by mouth in the morning and at bedtime.     DULoxetine (CYMBALTA) 60 MG capsule Take 60 mg by mouth in the morning  and at bedtime.     Evolocumab (REPATHA SURECLICK) 140 MG/ML SOAJ Inject 140 mg into the skin every 14 (fourteen) days.     gabapentin (NEURONTIN) 300 MG capsule Take 300 mg by mouth in the morning and at bedtime.     Polyethyl Glycol-Propyl Glycol (LUBRICANT EYE DROPS) 0.4-0.3 % SOLN Place 1 drop into both eyes 3 (three) times daily as needed (dry/irritated eyes.).     traMADol (ULTRAM) 50 MG tablet Take 1 tablet (50 mg total) by mouth every 6 (six) hours as needed. 20 tablet 0   No current facility-administered medications for this visit.   Allergies:  Patient has no known allergies.   Social History: The patient  reports that she has been smoking cigarettes. She has never used smokeless tobacco. She reports current alcohol use. She reports that she does not use drugs.   Family History: The patient's family history includes Breast cancer in her mother; Cancer in her mother; Chronic bronchitis in her brother; Coronary artery disease in her sister; Heart attack in her father; Hyperlipidemia in her brother and sister; Hypertension in her brother.   ROS:  Please see the history of present illness. Otherwise, complete review of systems is positive for none.  All other systems are reviewed and negative.   Physical Exam: VS:  BP 112/82 (BP Location: Left Arm, Patient Position: Sitting, Cuff  Size: Normal)   Pulse (!) 109   Ht 5' 1.5" (1.562 m)   Wt 134 lb (60.8 kg)   SpO2 90% Comment: told she has low sats  BMI 24.91 kg/m , BMI Body mass index is 24.91 kg/m.  Wt Readings from Last 3 Encounters:  08/15/23 134 lb (60.8 kg)  10/12/20 136 lb 0.4 oz (61.7 kg)  10/31/19 125 lb (56.7 kg)    General: Patient appears comfortable at rest. HEENT: Conjunctiva and lids normal, oropharynx clear with moist mucosa. Neck: Supple, no elevated JVP or carotid bruits, no thyromegaly. Lungs: Clear to auscultation, nonlabored breathing at rest. Cardiac: Regular rate and rhythm, no S3 or significant systolic  murmur, no pericardial rub. Abdomen: Soft, nontender, no hepatomegaly, bowel sounds present, no guarding or rebound. Extremities: No pitting edema, distal pulses 2+. Skin: Warm and dry. Musculoskeletal: No kyphosis. Neuropsychiatric: Alert and oriented x3, affect grossly appropriate.  Recent Labwork: No results found for requested labs within last 365 days.  No results found for: "CHOL", "TRIG", "HDL", "CHOLHDL", "VLDL", "LDLCALC", "LDLDIRECT"   Assessment and Plan:  DOE: Recently noticed getting short of breath with daily activities.  No orthopnea, PND or leg swelling.  Cardiac risk factors include chronic smoking history, elevated calcium score.  Will obtain echocardiogram and Lexiscan.  Elevated coronary calcium score of 931, 96 percentile for age and sex matched control: LAD 573, LM 268, LCx 83.5, RCA 6.7.  Same plan as above.  Already taking aspirin 81 mg once daily.  HLD management as stated below.  HLD, not at goal: LDL 117 in October 24.  Statin intolerant previously.  Currently on Repatha, atorvastatin 80 mg nightly.  Not on Zetia anymore.  Will need to repeat lipid panel, can be done by PCP.  Goal LDL less than 70.  Nicotine abuse: Currently smokes 1 pack/day.  Counseling provided.    Medication Adjustments/Labs and Tests Ordered: Current medicines are reviewed at length with the patient today.  Concerns regarding medicines are outlined above.    Disposition:  Follow up   Signed, Timon Geissinger Verne Spurr, MD, 08/15/2023 11:21 AM    [facility] [address] [place], [place] [address]

## 2023-09-05 ENCOUNTER — Other Ambulatory Visit: Payer: Self-pay

## 2023-09-05 DIAGNOSIS — E7849 Other hyperlipidemia: Secondary | ICD-10-CM

## 2023-09-05 DIAGNOSIS — I700 Atherosclerosis of aorta: Secondary | ICD-10-CM

## 2023-09-10 ENCOUNTER — Other Ambulatory Visit (HOSPITAL_COMMUNITY)

## 2023-09-10 ENCOUNTER — Encounter (HOSPITAL_COMMUNITY): Admission: RE | Admit: 2023-09-10 | Source: Ambulatory Visit

## 2023-09-10 ENCOUNTER — Encounter (HOSPITAL_COMMUNITY)

## 2023-09-12 ENCOUNTER — Telehealth (HOSPITAL_COMMUNITY): Payer: Self-pay | Admitting: Surgery

## 2023-09-12 NOTE — Telephone Encounter (Signed)
 I tried to call the pt to remind her of her stress test tomorrow, but her mailbox was full.

## 2023-09-13 ENCOUNTER — Ambulatory Visit (HOSPITAL_COMMUNITY)
Admission: RE | Admit: 2023-09-13 | Discharge: 2023-09-13 | Disposition: A | Discharge status: Home / Self Care | Source: Ambulatory Visit | Attending: Internal Medicine | Admitting: Internal Medicine

## 2023-09-13 ENCOUNTER — Encounter (HOSPITAL_COMMUNITY): Payer: Self-pay

## 2023-09-13 ENCOUNTER — Encounter (HOSPITAL_COMMUNITY)
Admission: RE | Admit: 2023-09-13 | Discharge: 2023-09-13 | Disposition: A | Discharge status: Home / Self Care | Source: Ambulatory Visit | Attending: Internal Medicine

## 2023-09-13 DIAGNOSIS — I700 Atherosclerosis of aorta: Secondary | ICD-10-CM | POA: Insufficient documentation

## 2023-09-13 DIAGNOSIS — R0609 Other forms of dyspnea: Secondary | ICD-10-CM | POA: Insufficient documentation

## 2023-09-18 ENCOUNTER — Ambulatory Visit (HOSPITAL_COMMUNITY)
Admission: RE | Admit: 2023-09-18 | Discharge: 2023-09-18 | Disposition: A | Discharge status: Home / Self Care | Source: Ambulatory Visit | Attending: Internal Medicine

## 2023-09-18 ENCOUNTER — Ambulatory Visit (HOSPITAL_BASED_OUTPATIENT_CLINIC_OR_DEPARTMENT_OTHER)
Admission: RE | Admit: 2023-09-18 | Discharge: 2023-09-18 | Disposition: A | Discharge status: Home / Self Care | Source: Ambulatory Visit | Attending: Internal Medicine | Admitting: Internal Medicine

## 2023-09-18 ENCOUNTER — Ambulatory Visit (HOSPITAL_COMMUNITY)
Admission: RE | Admit: 2023-09-18 | Discharge: 2023-09-18 | Disposition: A | Discharge status: Home / Self Care | Source: Ambulatory Visit | Attending: Internal Medicine | Admitting: Internal Medicine

## 2023-09-18 DIAGNOSIS — I700 Atherosclerosis of aorta: Secondary | ICD-10-CM

## 2023-09-18 DIAGNOSIS — R0609 Other forms of dyspnea: Secondary | ICD-10-CM | POA: Diagnosis present

## 2023-09-18 LAB — ECHOCARDIOGRAM COMPLETE
AR max vel: 2.07 cm2
AV Area VTI: 1.83 cm2
AV Area mean vel: 1.85 cm2
AV Mean grad: 4 mmHg
AV Peak grad: 6.1 mmHg
Ao pk vel: 1.23 m/s
Area-P 1/2: 4.93 cm2
Calc EF: 59.6 %
MV VTI: 2.86 cm2
S' Lateral: 2.4 cm
Single Plane A2C EF: 51.9 %
Single Plane A4C EF: 65.6 %

## 2023-09-18 LAB — NM MYOCAR MULTI W/SPECT W/WALL MOTION / EF
Exercise duration (min): 0 min
Exercise duration (sec): 0 s
LV dias vol: 34 mL (ref 46–106)
LV sys vol: 7 mL
MPHR: 148 {beats}/min
Nuc Stress EF: 79 %
Peak HR: 104 {beats}/min
Percent HR: 70 %
RATE: 0.3
Rest HR: 89 {beats}/min
Rest Nuclear Isotope Dose: 10.2 mCi
SDS: 3
SRS: 1
SSS: 4
Stress Nuclear Isotope Dose: 30 mCi
TID: 0.97

## 2023-09-18 MED ORDER — REGADENOSON 0.4 MG/5ML IV SOLN
INTRAVENOUS | Status: AC
  Administered 2023-09-18: 0.4 mg via INTRAVENOUS
  Filled 2023-09-18: qty 5

## 2023-09-18 MED ORDER — TECHNETIUM TC 99M TETROFOSMIN IV KIT
10.0000 | PACK | Freq: Once | INTRAVENOUS | Status: AC | PRN
  Administered 2023-09-18: 10.2 via INTRAVENOUS

## 2023-09-18 MED ORDER — SODIUM CHLORIDE FLUSH 0.9 % IV SOLN
INTRAVENOUS | Status: AC
  Filled 2023-09-18: qty 10

## 2023-09-18 MED ORDER — TECHNETIUM TC 99M TETROFOSMIN IV KIT
30.0000 | PACK | Freq: Once | INTRAVENOUS | Status: AC | PRN
  Administered 2023-09-18: 30 via INTRAVENOUS

## 2023-09-18 NOTE — Progress Notes (Signed)
  Echocardiogram 2D Echocardiogram has been performed.  Idy Rawling L Elissa Grieshop RDCS 09/18/2023, 9:53 AM

## 2023-09-21 ENCOUNTER — Telehealth: Payer: Self-pay | Admitting: Internal Medicine

## 2023-09-21 NOTE — Telephone Encounter (Signed)
 Attempted to contact patient regarding testing results- Mailbox full

## 2023-09-21 NOTE — Telephone Encounter (Signed)
-----   Message from Vishnu P Mallipeddi sent at 09/20/2023 12:23 PM EDT ----- Normal LVEF, G1 DD with normal LVEDP, normal RV function, no valvular heart disease, CVP 3 mmHg.  Overall, normal echocardiogram.  Echo findings do not explain DOE.

## 2023-09-21 NOTE — Telephone Encounter (Signed)
-----   Message from Vishnu P Mallipeddi sent at 09/20/2023 12:03 PM EDT ----- Stress test is normal.

## 2023-09-21 NOTE — Telephone Encounter (Signed)
 The patient has been notified of the result and verbalized understanding.  All questions (if any) were answered. Annabel Barns, RN 09/21/2023 4:44 PM

## 2023-09-21 NOTE — Telephone Encounter (Signed)
 Follow Up:      Patient is returning call from yesterday, concerning her test results.Aaron Aas

## 2023-10-11 ENCOUNTER — Other Ambulatory Visit (HOSPITAL_COMMUNITY): Payer: Self-pay | Admitting: Internal Medicine

## 2023-10-11 DIAGNOSIS — Z1231 Encounter for screening mammogram for malignant neoplasm of breast: Secondary | ICD-10-CM

## 2023-10-13 LAB — LIPID PANEL
Chol/HDL Ratio: 2.9 ratio (ref 0.0–4.4)
Cholesterol, Total: 157 mg/dL (ref 100–199)
HDL: 55 mg/dL (ref >39)
LDL Chol Calc (NIH): 75 mg/dL (ref 0–99)
Triglycerides: 158 mg/dL — ABNORMAL HIGH (ref 0–149)
VLDL Cholesterol Cal: 27 mg/dL (ref 5–40)

## 2023-10-18 ENCOUNTER — Ambulatory Visit: Payer: Self-pay | Admitting: Internal Medicine

## 2023-11-14 ENCOUNTER — Ambulatory Visit (HOSPITAL_COMMUNITY)
Admission: RE | Admit: 2023-11-14 | Discharge: 2023-11-14 | Disposition: A | Discharge status: Home / Self Care | Source: Ambulatory Visit | Attending: Internal Medicine | Admitting: Internal Medicine

## 2023-11-14 DIAGNOSIS — Z1231 Encounter for screening mammogram for malignant neoplasm of breast: Secondary | ICD-10-CM | POA: Insufficient documentation

## 2023-12-13 ENCOUNTER — Other Ambulatory Visit (HOSPITAL_COMMUNITY): Payer: Self-pay | Admitting: Internal Medicine

## 2023-12-13 DIAGNOSIS — Z13820 Encounter for screening for osteoporosis: Secondary | ICD-10-CM

## 2023-12-20 ENCOUNTER — Other Ambulatory Visit (HOSPITAL_COMMUNITY): Payer: Self-pay | Admitting: Radiology

## 2023-12-20 DIAGNOSIS — R0600 Dyspnea, unspecified: Secondary | ICD-10-CM

## 2024-03-18 ENCOUNTER — Other Ambulatory Visit (HOSPITAL_COMMUNITY): Payer: Self-pay | Admitting: Internal Medicine

## 2024-03-18 DIAGNOSIS — F17201 Nicotine dependence, unspecified, in remission: Secondary | ICD-10-CM

## 2024-03-29 ENCOUNTER — Ambulatory Visit (HOSPITAL_COMMUNITY)
Admission: RE | Admit: 2024-03-29 | Discharge: 2024-03-29 | Disposition: A | Discharge status: Home / Self Care | Source: Ambulatory Visit | Attending: Internal Medicine | Admitting: Internal Medicine

## 2024-03-29 DIAGNOSIS — F17201 Nicotine dependence, unspecified, in remission: Secondary | ICD-10-CM

## 2024-03-29 DIAGNOSIS — Z122 Encounter for screening for malignant neoplasm of respiratory organs: Secondary | ICD-10-CM | POA: Diagnosis present

## 2024-03-29 DIAGNOSIS — J439 Emphysema, unspecified: Secondary | ICD-10-CM | POA: Diagnosis not present

## 2024-03-29 DIAGNOSIS — F17210 Nicotine dependence, cigarettes, uncomplicated: Secondary | ICD-10-CM | POA: Diagnosis present

## 2024-03-29 DIAGNOSIS — I2510 Atherosclerotic heart disease of native coronary artery without angina pectoris: Secondary | ICD-10-CM | POA: Insufficient documentation

## 2024-03-29 DIAGNOSIS — I700 Atherosclerosis of aorta: Secondary | ICD-10-CM | POA: Insufficient documentation
